# Patient Record
Sex: Female | Born: 1975
Health system: Southern US, Community
[De-identification: ages and names within clinical notes are randomized; demographics above are authoritative.]

## PROBLEM LIST (undated history)

## (undated) DIAGNOSIS — Z973 Presence of spectacles and contact lenses: Secondary | ICD-10-CM

## (undated) DIAGNOSIS — J189 Pneumonia, unspecified organism: Secondary | ICD-10-CM

## (undated) DIAGNOSIS — IMO0002 Reserved for concepts with insufficient information to code with codable children: Secondary | ICD-10-CM

## (undated) DIAGNOSIS — G43909 Migraine, unspecified, not intractable, without status migrainosus: Secondary | ICD-10-CM

## (undated) DIAGNOSIS — M069 Rheumatoid arthritis, unspecified: Secondary | ICD-10-CM

## (undated) DIAGNOSIS — R51 Headache: Secondary | ICD-10-CM

## (undated) DIAGNOSIS — R87619 Unspecified abnormal cytological findings in specimens from cervix uteri: Secondary | ICD-10-CM

## (undated) DIAGNOSIS — Z8759 Personal history of other complications of pregnancy, childbirth and the puerperium: Secondary | ICD-10-CM

## (undated) DIAGNOSIS — M1990 Unspecified osteoarthritis, unspecified site: Secondary | ICD-10-CM

## (undated) DIAGNOSIS — O139 Gestational [pregnancy-induced] hypertension without significant proteinuria, unspecified trimester: Secondary | ICD-10-CM

## (undated) DIAGNOSIS — F419 Anxiety disorder, unspecified: Secondary | ICD-10-CM

## (undated) DIAGNOSIS — K219 Gastro-esophageal reflux disease without esophagitis: Secondary | ICD-10-CM

## (undated) HISTORY — DX: Rheumatoid arthritis, unspecified: M06.9

## (undated) HISTORY — DX: Unspecified abnormal cytological findings in specimens from cervix uteri: R87.619

## (undated) HISTORY — PX: WISDOM TOOTH EXTRACTION: SHX21

## (undated) HISTORY — PX: GASTRIC BYPASS: SHX52

## (undated) HISTORY — DX: Headache: R51

## (undated) HISTORY — DX: Pneumonia, unspecified organism: J18.9

## (undated) HISTORY — DX: Gastro-esophageal reflux disease without esophagitis: K21.9

## (undated) HISTORY — DX: Migraine, unspecified, not intractable, without status migrainosus: G43.909

## (undated) HISTORY — DX: Reserved for concepts with insufficient information to code with codable children: IMO0002

---

## 2000-12-22 ENCOUNTER — Other Ambulatory Visit: Admission: RE | Admit: 2000-12-22 | Discharge: 2000-12-22 | Payer: Self-pay | Admitting: Obstetrics and Gynecology

## 2001-02-10 ENCOUNTER — Encounter: Payer: Self-pay | Admitting: Obstetrics and Gynecology

## 2001-02-10 ENCOUNTER — Ambulatory Visit (HOSPITAL_COMMUNITY): Admission: RE | Admit: 2001-02-10 | Discharge: 2001-02-10 | Payer: Self-pay | Admitting: Obstetrics and Gynecology

## 2001-05-25 ENCOUNTER — Ambulatory Visit (HOSPITAL_COMMUNITY): Admission: RE | Admit: 2001-05-25 | Discharge: 2001-05-25 | Payer: Self-pay | Admitting: Obstetrics and Gynecology

## 2001-05-25 ENCOUNTER — Encounter: Payer: Self-pay | Admitting: Obstetrics and Gynecology

## 2001-06-23 ENCOUNTER — Inpatient Hospital Stay (HOSPITAL_COMMUNITY): Admission: AD | Admit: 2001-06-23 | Discharge: 2001-06-23 | Payer: Self-pay | Admitting: Obstetrics and Gynecology

## 2001-07-07 ENCOUNTER — Inpatient Hospital Stay (HOSPITAL_COMMUNITY): Admission: AD | Admit: 2001-07-07 | Discharge: 2001-07-09 | Payer: Self-pay | Admitting: Obstetrics and Gynecology

## 2001-07-07 ENCOUNTER — Inpatient Hospital Stay (HOSPITAL_COMMUNITY): Admission: AD | Admit: 2001-07-07 | Discharge: 2001-07-07 | Payer: Self-pay | Admitting: Obstetrics and Gynecology

## 2002-07-26 ENCOUNTER — Ambulatory Visit (HOSPITAL_COMMUNITY): Admission: RE | Admit: 2002-07-26 | Discharge: 2002-07-26 | Payer: Self-pay | Admitting: Obstetrics and Gynecology

## 2002-07-26 ENCOUNTER — Encounter: Payer: Self-pay | Admitting: Obstetrics and Gynecology

## 2002-11-13 ENCOUNTER — Inpatient Hospital Stay (HOSPITAL_COMMUNITY): Admission: AD | Admit: 2002-11-13 | Discharge: 2002-11-13 | Payer: Self-pay | Admitting: Obstetrics and Gynecology

## 2002-12-05 ENCOUNTER — Other Ambulatory Visit: Admission: RE | Admit: 2002-12-05 | Discharge: 2002-12-05 | Payer: Self-pay | Admitting: Obstetrics and Gynecology

## 2002-12-16 ENCOUNTER — Inpatient Hospital Stay (HOSPITAL_COMMUNITY): Admission: AD | Admit: 2002-12-16 | Discharge: 2002-12-16 | Payer: Self-pay | Admitting: Obstetrics and Gynecology

## 2002-12-22 ENCOUNTER — Inpatient Hospital Stay (HOSPITAL_COMMUNITY): Admission: AD | Admit: 2002-12-22 | Discharge: 2002-12-24 | Payer: Self-pay | Admitting: Obstetrics and Gynecology

## 2003-06-16 ENCOUNTER — Emergency Department (HOSPITAL_COMMUNITY): Admission: EM | Admit: 2003-06-16 | Discharge: 2003-06-16 | Payer: Self-pay | Admitting: Emergency Medicine

## 2004-01-03 ENCOUNTER — Other Ambulatory Visit: Admission: RE | Admit: 2004-01-03 | Discharge: 2004-01-03 | Payer: Self-pay | Admitting: Obstetrics and Gynecology

## 2005-12-16 ENCOUNTER — Other Ambulatory Visit: Admission: RE | Admit: 2005-12-16 | Discharge: 2005-12-16 | Payer: Self-pay | Admitting: Obstetrics and Gynecology

## 2011-06-18 ENCOUNTER — Ambulatory Visit: Payer: BC Managed Care – PPO | Admitting: Obstetrics and Gynecology

## 2011-07-07 ENCOUNTER — Ambulatory Visit (INDEPENDENT_AMBULATORY_CARE_PROVIDER_SITE_OTHER): Payer: BC Managed Care – PPO | Admitting: Obstetrics and Gynecology

## 2011-07-07 DIAGNOSIS — N898 Other specified noninflammatory disorders of vagina: Secondary | ICD-10-CM

## 2011-07-07 DIAGNOSIS — Z01419 Encounter for gynecological examination (general) (routine) without abnormal findings: Secondary | ICD-10-CM

## 2011-08-16 ENCOUNTER — Telehealth: Payer: Self-pay | Admitting: Obstetrics and Gynecology

## 2011-08-16 NOTE — Telephone Encounter (Signed)
Routed to triage/pt appt was canc. For today/no resched. yet

## 2011-08-17 ENCOUNTER — Encounter: Payer: BC Managed Care – PPO | Admitting: Obstetrics and Gynecology

## 2011-08-18 ENCOUNTER — Telehealth: Payer: Self-pay

## 2011-08-18 NOTE — Telephone Encounter (Signed)
Spoke with pt rgd msg to r/s colpo pt has appt 09/09/11 at 3:15 with ND pt voice understanding

## 2011-08-18 NOTE — Telephone Encounter (Signed)
Message copied by Rolla Plate on Wed Aug 18, 2011 11:11 AM ------      Message from: Nicanor Bake      Created: Wed Aug 18, 2011  8:55 AM      Regarding: no show       Patient was scheduled for colpo and did not show up for appt.  Please r/s.  Thanks

## 2011-09-08 ENCOUNTER — Encounter: Payer: Self-pay | Admitting: Obstetrics and Gynecology

## 2011-09-08 DIAGNOSIS — K219 Gastro-esophageal reflux disease without esophagitis: Secondary | ICD-10-CM | POA: Insufficient documentation

## 2011-09-08 DIAGNOSIS — Z8679 Personal history of other diseases of the circulatory system: Secondary | ICD-10-CM | POA: Insufficient documentation

## 2011-09-08 DIAGNOSIS — R4689 Other symptoms and signs involving appearance and behavior: Secondary | ICD-10-CM | POA: Insufficient documentation

## 2011-09-08 DIAGNOSIS — O98919 Unspecified maternal infectious and parasitic disease complicating pregnancy, unspecified trimester: Secondary | ICD-10-CM | POA: Insufficient documentation

## 2011-09-09 ENCOUNTER — Encounter (INDEPENDENT_AMBULATORY_CARE_PROVIDER_SITE_OTHER): Payer: BC Managed Care – PPO

## 2011-09-09 ENCOUNTER — Ambulatory Visit (INDEPENDENT_AMBULATORY_CARE_PROVIDER_SITE_OTHER): Payer: BC Managed Care – PPO | Admitting: Obstetrics and Gynecology

## 2011-09-09 ENCOUNTER — Encounter: Payer: Self-pay | Admitting: Obstetrics and Gynecology

## 2011-09-09 VITALS — BP 138/80 | Ht 67.0 in | Wt 292.0 lb

## 2011-09-09 DIAGNOSIS — R87610 Atypical squamous cells of undetermined significance on cytologic smear of cervix (ASC-US): Secondary | ICD-10-CM

## 2011-09-09 DIAGNOSIS — IMO0001 Reserved for inherently not codable concepts without codable children: Secondary | ICD-10-CM

## 2011-09-09 LAB — POCT URINE PREGNANCY: Preg Test, Ur: NEGATIVE

## 2011-09-09 NOTE — Progress Notes (Signed)
Previous Pap Smear: 07/07/11 HPV detected, ASCUS Previous Colposcopy: 07/29/10 biopsy at 6 o'clock, no atypia identified  Referred From: n/a LMP: 08/02/11 Contraception: Lo/ovral  G,P: 2, 2 UPT negative  Gave pt Ibuprofen 600 mg Parker, Charetha Aw changes and mosacisim seen at 11oclock.  Biopsy taken from 11 o clock with ECC.  monsels applied and hemostasis noted Will call pt with results.

## 2011-09-15 LAB — PATHOLOGY

## 2011-09-28 ENCOUNTER — Telehealth: Payer: Self-pay

## 2011-09-28 NOTE — Telephone Encounter (Signed)
Message copied by Rolla Plate on Tue Sep 28, 2011  9:17 AM ------      Message from: Jaymes Graff      Created: Mon Sep 27, 2011  8:12 PM       Please review colpo results with patient and tell her I recommend a pap every six months for the next year.

## 2011-09-28 NOTE — Telephone Encounter (Signed)
Spoke with pt rgd labs informed colpo consistent with pap repeat pap every 6 months pt voice understanding 

## 2011-11-08 ENCOUNTER — Ambulatory Visit (INDEPENDENT_AMBULATORY_CARE_PROVIDER_SITE_OTHER): Payer: BC Managed Care – PPO | Admitting: Family Medicine

## 2011-11-08 ENCOUNTER — Telehealth: Payer: Self-pay | Admitting: Obstetrics and Gynecology

## 2011-11-08 VITALS — BP 130/88 | HR 73 | Temp 98.0°F | Resp 16 | Ht 66.5 in | Wt 293.0 lb

## 2011-11-08 DIAGNOSIS — R062 Wheezing: Secondary | ICD-10-CM

## 2011-11-08 DIAGNOSIS — J45909 Unspecified asthma, uncomplicated: Secondary | ICD-10-CM

## 2011-11-08 MED ORDER — ALBUTEROL SULFATE HFA 108 (90 BASE) MCG/ACT IN AERS: 2.0000 | INHALATION_SPRAY | Freq: Four times a day (QID) | RESPIRATORY_TRACT | Status: DC | PRN

## 2011-11-08 MED ORDER — ALBUTEROL SULFATE (2.5 MG/3ML) 0.083% IN NEBU
2.5000 mg | INHALATION_SOLUTION | Freq: Once | RESPIRATORY_TRACT | Status: AC
  Administered 2011-11-08: 2.5 mg via RESPIRATORY_TRACT

## 2011-11-08 NOTE — Progress Notes (Signed)
Date:  11/08/2011   Name:  Pamela Hunt   DOB:  [birth year]   MRN:  [id number]  PCP:  Michael Litter, MD    Chief Complaint: Asthma   History of Present Illness:  Pamela Hunt is a 36 y.o. very pleasant female patient who presents with the following:  She was exposed to dust yesterday while cleaning- this morning she noted SOB and wheezing.  She did not have an inhaler available at home. She does tend to have wheezing at times, especially when she is ill or exposed to an allergen.  Otherwise her asthma symptoms are minimal.  She has not noted any other symptoms of illness such as cough, fever, or URI symptoms.   LMP was last month- she takes 90 day OCP cycle.  States there is no risk of pregnancy.   Patient Active Problem List  Diagnosis  . Hx of acid reflux disease  . Hx of closely spaced pregnancy  . Hx of asthma  . Hx of increased body habitus    Past Medical History  Diagnosis Date  . Headache     migraines  . Asthma   . Abnormal Pap smear   . Pneumonia     Had as a child    No past surgical history on file.  History  Substance Use Topics  . Smoking status: Never Smoker   . Smokeless tobacco: Not on file  . Alcohol Use: Yes     socially     Family History  Problem Relation Age of Onset  . Hypertension Mother   . Thyroid nodules Mother     Allergies  Allergen Reactions  . Tetracyclines & Related   . Tomato     Medication list has been reviewed and updated.  Current Outpatient Prescriptions on File Prior to Visit  Medication Sig Dispense Refill  . norgestrel-ethinyl estradiol (LO/OVRAL,CRYSELLE) 0.3-30 MG-MCG tablet Take 1 tablet by mouth daily.      Marland Kitchen topiramate (TOPAMAX) 100 MG tablet Take 100 mg by mouth 2 (two) times daily.        Review of Systems:  As per HPI- otherwise negative.   Physical Examination: Filed Vitals:   11/08/11 1316  BP: 130/88  Pulse: 73  Temp: 98 F (36.7 C)  Resp: 16   Filed Vitals:   11/08/11 1316    Height: 5' 6.5" (1.689 m)  Weight: 293 lb (132.904 kg)   Body mass index is 46.58 kg/(m^2). Ideal Body Weight: Weight in (lb) to have BMI = 25: 156.9   GEN: WDWN, NAD, Non-toxic, A & O x 3, obese HEENT: Atraumatic, Normocephalic. Neck supple. No masses, No LAD.  TM and oropharynx wnl, PEERL, EOMI.   Ears and Nose: No external deformity. CV: RRR, No M/G/R. No JVD. No thrill. No extra heart sounds. PULM: CTA B, no wheezes, crackles, rhonchi. No retractions. No resp. distress. No accessory muscle use.  Some decreased air movement.  Gave albuterol neb- she felt a lot better and air movement improved.   EXTR: No c/c/e NEURO Normal gait.  PSYCH: Normally interactive. Conversant. Not depressed or anxious appearing.  Calm demeanor.    Assessment and Plan: 1. Asthma  albuterol (PROVENTIL HFA;VENTOLIN HFA) 108 (90 BASE) MCG/ACT inhaler  2. Wheezing  albuterol (PROVENTIL) (2.5 MG/3ML) 0.083% nebulizer solution 2.5 mg   Periodic RAD/ asthma type symptoms.  Gave albuterol neb- she felt a lot better.  Gave inhaler to use at home as needed. Let me know if  symptoms continue or change.    Abbe Amsterdam, MD

## 2011-11-08 NOTE — Telephone Encounter (Signed)
Spoke with pt rgd msg pt wants refill on albuterol inhaler advised pt rx written in 2004 need ov need to f/u with pcp for rx for inhaler pt voice understanding

## 2011-11-18 ENCOUNTER — Telehealth: Payer: Self-pay

## 2011-11-18 ENCOUNTER — Other Ambulatory Visit: Payer: Self-pay

## 2011-11-18 MED ORDER — NORGESTREL-ETHINYL ESTRADIOL 0.3-30 MG-MCG PO TABS: 1.0000 | ORAL_TABLET | Freq: Every day | ORAL | Status: DC

## 2011-11-18 NOTE — Telephone Encounter (Signed)
Spoke with pt rgd msg informed rx sent to pharm pt voice understanding 

## 2011-11-24 ENCOUNTER — Telehealth: Payer: Self-pay | Admitting: Obstetrics and Gynecology

## 2011-11-24 MED ORDER — NORETHINDRONE ACET-ETHINYL EST 1.5-30 MG-MCG PO TABS: 1.0000 | ORAL_TABLET | Freq: Every day | ORAL | Status: DC

## 2011-11-24 NOTE — Telephone Encounter (Signed)
NICCOLE/ND PT °

## 2011-11-24 NOTE — Telephone Encounter (Signed)
Spoke with pt states rx should be for 3 month supply advised pt rx sent to pharm pt voice understadning

## 2012-04-19 ENCOUNTER — Other Ambulatory Visit: Payer: Self-pay | Admitting: Obstetrics and Gynecology

## 2012-04-27 ENCOUNTER — Other Ambulatory Visit: Payer: Self-pay | Admitting: Obstetrics and Gynecology

## 2012-05-25 ENCOUNTER — Ambulatory Visit (INDEPENDENT_AMBULATORY_CARE_PROVIDER_SITE_OTHER): Payer: BC Managed Care – PPO | Admitting: Physician Assistant

## 2012-05-25 VITALS — BP 140/90 | HR 98 | Temp 98.8°F | Resp 16 | Ht 66.5 in | Wt 302.4 lb

## 2012-05-25 DIAGNOSIS — R059 Cough, unspecified: Secondary | ICD-10-CM

## 2012-05-25 DIAGNOSIS — J45909 Unspecified asthma, uncomplicated: Secondary | ICD-10-CM

## 2012-05-25 DIAGNOSIS — R05 Cough: Secondary | ICD-10-CM

## 2012-05-25 DIAGNOSIS — J069 Acute upper respiratory infection, unspecified: Secondary | ICD-10-CM

## 2012-05-25 MED ORDER — HYDROCODONE-HOMATROPINE 5-1.5 MG/5ML PO SYRP: 5.0000 mL | ORAL_SOLUTION | Freq: Three times a day (TID) | ORAL | Status: DC | PRN

## 2012-05-25 MED ORDER — IPRATROPIUM BROMIDE 0.03 % NA SOLN: 2.0000 | Freq: Two times a day (BID) | NASAL | Status: DC

## 2012-05-25 MED ORDER — BENZONATATE 100 MG PO CAPS: 100.0000 mg | ORAL_CAPSULE | Freq: Three times a day (TID) | ORAL | Status: DC | PRN

## 2012-05-25 MED ORDER — ALBUTEROL SULFATE HFA 108 (90 BASE) MCG/ACT IN AERS: 2.0000 | INHALATION_SPRAY | RESPIRATORY_TRACT | Status: DC | PRN

## 2012-05-25 NOTE — Progress Notes (Signed)
Subjective:    Patient ID: Pamela Hunt, female    DOB: 08-31-1975, 37 y.o.   MRN: [id number]  HPI   Ms. Pamela Hunt is a 37 yr old female here with concern for illness.  States she has been sick for the last 5 days.  Most bothersome symptom is non-productive cough.  States she is really tired.  Endorses runny nose, sore throat, post-nasal drainage, and chills.  No fever.  Some headache and body aches as well.  No flu shot this season.  Denies sinus pressure.  Experiences both SOB and wheezing after she coughs a lot.  Currently has no chest tightness, wheezing, or SOB.    Hx asthma treated with albuterol only.  States she uses albuterol once per month, if that.  She has not used albuterol since she's been sick.    No GI or urinary symptoms.  Son had a sore throat last weekend, otherwise no other sick contacts.  Tylenol cold for symptom relief.  Feeling somewhat better but chest feels sore from coughing so much.  Cough is especially bothersome at night.     Review of Systems  Constitutional: Positive for chills, appetite change (decreased) and fatigue. Negative for fever.  HENT: Positive for congestion, sore throat, rhinorrhea and postnasal drip. Negative for ear pain and sinus pressure.   Respiratory: Positive for cough, shortness of breath and wheezing.   Cardiovascular: Positive for chest pain (with cough).  Gastrointestinal: Negative.   Musculoskeletal: Positive for myalgias and arthralgias.  Skin: Negative.   Neurological: Negative.        Objective:   Physical Exam  Vitals reviewed. Constitutional: She is oriented to person, place, and time. She appears well-developed and well-nourished. No distress.  HENT:  Head: Normocephalic and atraumatic.  Right Ear: Tympanic membrane and ear canal normal.  Left Ear: Tympanic membrane and ear canal normal.  Nose: Rhinorrhea present. Right sinus exhibits no maxillary sinus tenderness and no frontal sinus tenderness. Left sinus exhibits no  maxillary sinus tenderness and no frontal sinus tenderness.  Mouth/Throat: Uvula is midline, oropharynx is clear and moist and mucous membranes are normal.  Eyes: Conjunctivae normal are normal. No scleral icterus.  Neck: Neck supple.  Cardiovascular: Normal rate, regular rhythm, normal heart sounds and intact distal pulses.  Exam reveals no gallop and no friction rub.   No murmur heard. Pulmonary/Chest: Effort normal and breath sounds normal. She has no decreased breath sounds. She has no wheezes. She has no rhonchi. She has no rales.  Lymphadenopathy:    She has no cervical adenopathy.  Neurological: She is alert and oriented to person, place, and time.  Skin: Skin is warm.  Psychiatric: She has a normal mood and affect. Her behavior is normal.     Filed Vitals:   05/25/12 1059  BP: 140/90  Pulse: 98  Temp: 98.8 F (37.1 C)  Resp: 16  SpO2 98% on RA      Assessment & Plan:   1. Cough  benzonatate (TESSALON) 100 MG capsule, HYDROcodone-homatropine (HYCODAN) 5-1.5 MG/5ML syrup  2. URI (upper respiratory infection)  ipratropium (ATROVENT) 0.03 % nasal spray  3. Asthma  albuterol (PROVENTIL HFA;VENTOLIN HFA) 108 (90 BASE) MCG/ACT inhaler    Ms. Pamela Hunt is a pleasant 37 yr old female here with URI symptoms and underlying asthma.  She is afebrile, lungs are CTA, and cough is nonproductive.  Symptoms sounds somewhat flu-like, but given that she is 5 days out from symptom onset, testing would not change management.  Will treat cough with scheduled albuterol q6h for then next 1-2 days, then prn.  Tessalon and Hycodan for cough suppression.  Atrovent nasal and otc antihistamine for nasal symptoms and post-nasal drainage.  Push fluids.  Discussed RTC precautions.  Pt understands and is in agreement with this plan.

## 2012-05-25 NOTE — Patient Instructions (Addendum)
Begin using the albuterol every 6 hours for the next 1-2 days, then as needed after that.  Tessalon Perles for cough during the day, and Hycodan syrup for cough at night - this will make you sleepy.  Atrovent nasal spray twice a day to help with runny nose and post-nasal drainage.  An allergy medication will also be beneficial - you can get this generic over the counter (Claritin, Zyrtec, or Allegra).  Plenty of fluids (water is best!) and rest.  Please let us know if you feel like you're worsening or not improving, specifically if you develop fever >101.24F, worsening cough, worsening shortness of breath, or if new symptoms develop,   Upper Respiratory Infection, Adult An upper respiratory infection (URI) is also sometimes known as the common cold. The upper respiratory tract includes the nose, sinuses, throat, trachea, and bronchi. Bronchi are the airways leading to the lungs. Most people improve within 1 week, but symptoms can last up to 2 weeks. A residual cough may last even longer.  CAUSES Many different viruses can infect the tissues lining the upper respiratory tract. The tissues become irritated and inflamed and often become very moist. Mucus production is also common. A cold is contagious. You can easily spread the virus to others by oral contact. This includes kissing, sharing a glass, coughing, or sneezing. Touching your mouth or nose and then touching a surface, which is then touched by another person, can also spread the virus. SYMPTOMS  Symptoms typically develop 1 to 3 days after you come in contact with a cold virus. Symptoms vary from person to person. They may include:  Runny nose.  Sneezing.  Nasal congestion.  Sinus irritation.  Sore throat.  Loss of voice (laryngitis).  Cough.  Fatigue.  Muscle aches.  Loss of appetite.  Headache.  Low-grade fever. DIAGNOSIS  You might diagnose your own cold based on familiar symptoms, since most people get a cold 2 to 3 times a  year. Your caregiver can confirm this based on your exam. Most importantly, your caregiver can check that your symptoms are not due to another disease such as strep throat, sinusitis, pneumonia, asthma, or epiglottitis. Blood tests, throat tests, and X-rays are not necessary to diagnose a common cold, but they may sometimes be helpful in excluding other more serious diseases. Your caregiver will decide if any further tests are required. RISKS AND COMPLICATIONS  You may be at risk for a more severe case of the common cold if you smoke cigarettes, have chronic heart disease (such as heart failure) or lung disease (such as asthma), or if you have a weakened immune system. The very young and very old are also at risk for more serious infections. Bacterial sinusitis, middle ear infections, and bacterial pneumonia can complicate the common cold. The common cold can worsen asthma and chronic obstructive pulmonary disease (COPD). Sometimes, these complications can require emergency medical care and may be life-threatening. PREVENTION  The best way to protect against getting a cold is to practice good hygiene. Avoid oral or hand contact with people with cold symptoms. Wash your hands often if contact occurs. There is no clear evidence that vitamin C, vitamin E, echinacea, or exercise reduces the chance of developing a cold. However, it is always recommended to get plenty of rest and practice good nutrition. TREATMENT  Treatment is directed at relieving symptoms. There is no cure. Antibiotics are not effective, because the infection is caused by a virus, not by bacteria. Treatment may include:  Increased  fluid intake. Sports drinks offer valuable electrolytes, sugars, and fluids.  Breathing heated mist or steam (vaporizer or shower).  Eating chicken soup or other clear broths, and maintaining good nutrition.  Getting plenty of rest.  Using gargles or lozenges for comfort.  Controlling fevers with ibuprofen  or acetaminophen as directed by your caregiver.  Increasing usage of your inhaler if you have asthma. Zinc gel and zinc lozenges, taken in the first 24 hours of the common cold, can shorten the duration and lessen the severity of symptoms. Pain medicines may help with fever, muscle aches, and throat pain. A variety of non-prescription medicines are available to treat congestion and runny nose. Your caregiver can make recommendations and may suggest nasal or lung inhalers for other symptoms.  HOME CARE INSTRUCTIONS   Only take over-the-counter or prescription medicines for pain, discomfort, or fever as directed by your caregiver.  Use a warm mist humidifier or inhale steam from a shower to increase air moisture. This may keep secretions moist and make it easier to breathe.  Drink enough water and fluids to keep your urine clear or pale yellow.  Rest as needed.  Return to work when your temperature has returned to normal or as your caregiver advises. You may need to stay home longer to avoid infecting others. You can also use a face mask and careful hand washing to prevent spread of the virus. SEEK MEDICAL CARE IF:   After the first few days, you feel you are getting worse rather than better.  You need your caregiver's advice about medicines to control symptoms.  You develop chills, worsening shortness of breath, or brown or red sputum. These may be signs of pneumonia.  You develop yellow or brown nasal discharge or pain in the face, especially when you bend forward. These may be signs of sinusitis.  You develop a fever, swollen neck glands, pain with swallowing, or white areas in the back of your throat. These may be signs of strep throat. SEEK IMMEDIATE MEDICAL CARE IF:   You have a fever.  You develop severe or persistent headache, ear pain, sinus pain, or chest pain.  You develop wheezing, a prolonged cough, cough up blood, or have a change in your usual mucus (if you have chronic  lung disease).  You develop sore muscles or a stiff neck. Document Released: 09/29/2000 Document Revised: 06/28/2011 Document Reviewed: 08/07/2010 [facility] Patient Information 2013 [place], [place].

## 2013-02-08 ENCOUNTER — Ambulatory Visit (INDEPENDENT_AMBULATORY_CARE_PROVIDER_SITE_OTHER): Payer: BC Managed Care – PPO | Admitting: General Surgery

## 2013-02-08 ENCOUNTER — Encounter (INDEPENDENT_AMBULATORY_CARE_PROVIDER_SITE_OTHER): Payer: Self-pay | Admitting: General Surgery

## 2013-02-08 VITALS — BP 130/77 | HR 76 | Temp 98.1°F | Resp 14 | Ht 67.0 in | Wt 326.2 lb

## 2013-02-08 DIAGNOSIS — Z6841 Body Mass Index (BMI) 40.0 and over, adult: Secondary | ICD-10-CM

## 2013-02-08 NOTE — Progress Notes (Signed)
Patient ID: Pamela Hunt Hunt, female   DOB: 07/09/1975, 37 y.o.   MRN: [id number]  Chief Complaint  Patient presents with  . Bariatric Pre-op  . Weight Loss Surgery    HPI Pamela Hunt Hunt is a 37 y.o. female.   HPI 37 year old [place] American female referred by Dr. Shirlean Mylar for evaluation of weight loss surgery. The patient is specifically interested in laparoscopic Roux-en-Y gastric bypass. She states that she has done a lot of research and she feels this is the best procedure for her based on its long-term data. The patient has struggled her with her weight most of her adult life. Despite numerous attempts for sustained weight loss the patient has been unsuccessful. Over the years the patient has tried Vickey Sages, [employer], Doylene Bode, Slim fast, herbal life, Nutrisystem, but have a life as well as hCG without any long-term success.  Past Medical History  Diagnosis Date  . Headache(784.0)     migraines  . Asthma   . Abnormal Pap smear   . Pneumonia     Had as a child    History reviewed. No pertinent past surgical history.  Family History  Problem Relation Age of Onset  . Thyroid nodules Mother   . Thyroid nodules Father   . Arthritis Brother   . Thyroid nodules Maternal Grandmother   . Cancer Paternal Grandmother   . Heart disease Paternal Grandfather     Social History History  Substance Use Topics  . Smoking status: Never Smoker   . Smokeless tobacco: Not on file  . Alcohol Use: Yes     Comment: socially     Allergies  Allergen Reactions  . Tetracyclines & Related   . Tomato     Current Outpatient Prescriptions  Medication Sig Dispense Refill  . albuterol (PROVENTIL HFA;VENTOLIN HFA) 108 (90 BASE) MCG/ACT inhaler Inhale 2 puffs into the lungs every 4 (four) hours as needed for wheezing (cough, shortness of breath or wheezing.).  1 Inhaler  3  . benzonatate (TESSALON) 100 MG capsule Take 1-2 capsules (100-200 mg total) by mouth 3 (three) times daily as  needed for cough.  40 capsule  0  . HYDROcodone-homatropine (HYCODAN) 5-1.5 MG/5ML syrup Take 5 mLs by mouth every 8 (eight) hours as needed for cough.  120 mL  0  . ipratropium (ATROVENT) 0.03 % nasal spray Place 2 sprays into the nose 2 (two) times daily.  30 mL  1  . JUNEL 1.5/30 1.5-30 MG-MCG tablet TAKE 1 TABLET BY MOUTH DAILY.  63 tablet  1  . topiramate (TOPAMAX) 100 MG tablet Take 100 mg by mouth 2 (two) times daily.      Marland Kitchen albuterol (PROVENTIL HFA;VENTOLIN HFA) 108 (90 BASE) MCG/ACT inhaler Inhale 2 puffs into the lungs every 6 (six) hours as needed for wheezing.  1 Inhaler  4   No current facility-administered medications for this visit.    Review of Systems Review of Systems  Constitutional: Negative for fever, activity change, appetite change and unexpected weight change.  HENT: Negative for nosebleeds and trouble swallowing.   Eyes: Negative for photophobia, discharge and visual disturbance.  Respiratory: Positive for wheezing. Negative for chest tightness and shortness of breath.        Uses inhalers prn; no hospitalizations for asthma; reports she had a negative sleep study 3 yrs ago at the sleep center   Cardiovascular: Negative for chest pain and leg swelling.       Denies CP, SOB, orthopnea, PND, DOE  Gastrointestinal: Negative for nausea, vomiting, abdominal pain, diarrhea, constipation and abdominal distention.       Takes ompeprazole daily for reflux  Endocrine: Negative for cold intolerance, heat intolerance, polydipsia and polyuria.  Genitourinary: Negative for dysuria, urgency, hematuria, difficulty urinating and menstrual problem.       G2P2  Musculoskeletal: Negative for arthralgias.       Reports low back and rt knee pain  Skin: Negative for pallor and rash.  Neurological: Positive for headaches (with menstrual periods). Negative for dizziness, seizures, facial asymmetry and numbness.       Denies TIA and amaurosis fugax   Hematological: Negative for  adenopathy. Does not bruise/bleed easily.  Psychiatric/Behavioral: Negative for behavioral problems and agitation.    Blood pressure 130/77, pulse 76, temperature 98.1 F (36.7 C), temperature source Temporal, resp. rate 14, height 5\' 7"  (1.702 m), weight 326 lb 3.2 oz (147.963 kg).  Physical Exam Physical Exam  Data Reviewed Self diet history Epworth Sleepiness Scale score 2  Assessment    Morbid obesity BMI 51.17 Elevated Blood pressure Right knee pain Low back pain Asthma     Plan    The patient meets weight loss surgery criteria. I think the patient would be an acceptable candidate for Laparoscopic Roux-en-Y Gastric bypass.   We discussed laparoscopic Roux-en-Y gastric bypass. We discussed the preoperative, operative and postoperative process. Using diagrams, I explained the surgery in detail including the performance of an EGD near the end of the surgery and an Upper GI swallow study on POD 1. We discussed the typical hospital course including a 2-3 day stay baring any complications.   The patient was given educational material. I quoted the patient that they can expect to lose 50-70% of their excess weight with the gastric bypass. We did discuss the possibility of weight regain several years after the procedure.  We discussed the risk and benefits of surgery including but not limited to anesthesia risk, bleeding, infection, anastomotic edema requiring a few additional days in the hospital, postop nausea, possible conversion to open procedure, blood clot formation, anastomotic leak, anastomotic stricture, ulcer formation, death, respiratory complications, intestinal blockage, internal hernia, gallstone formation, vitamin and nutritional deficiencies, hair loss, weight regain injury to surrounding structures, failure to lose weight and mood changes.  We discussed that before and after surgery that there would be an alteration in their diet. I explained that we have put them on a  diet 2 weeks before surgery. I also explained that they would be on a liquid diet for 2 weeks after surgery. We discussed that they would have to avoid certain foods such as sugar after surgery. We discussed the importance of physical activity as well as compliance with our dietary and supplement recommendations and routine follow-up.  I explained to the patient that we will start our evaluation process which includes labs, Upper GI to evaluate stomach and swallowing anatomy, nutritionist consultation, psychiatrist consultation, EKG, CXR, abdominal ultrasound.  Mary Sella. Andrey Campanile, MD, FACS General, Bariatric, & Minimally Invasive Surgery [facility], [place]             [facility] M 02/08/2013, 12:35 PM

## 2013-02-08 NOTE — Patient Instructions (Signed)
We will start the process Please call me with any questions  

## 2013-02-26 ENCOUNTER — Ambulatory Visit (HOSPITAL_COMMUNITY): Payer: BC Managed Care – PPO

## 2013-02-26 ENCOUNTER — Other Ambulatory Visit (HOSPITAL_COMMUNITY): Payer: Self-pay

## 2013-02-28 ENCOUNTER — Encounter: Payer: Self-pay | Admitting: Dietician

## 2013-02-28 ENCOUNTER — Encounter: Payer: BC Managed Care – PPO | Attending: General Surgery | Admitting: Dietician

## 2013-02-28 VITALS — Ht 67.0 in | Wt 328.1 lb

## 2013-02-28 DIAGNOSIS — Z713 Dietary counseling and surveillance: Secondary | ICD-10-CM | POA: Insufficient documentation

## 2013-02-28 NOTE — Patient Instructions (Signed)
Patient to call the [facility] to enroll in Pre-Op and Post-Op Nutrition Education when surgery date is scheduled. 

## 2013-02-28 NOTE — Progress Notes (Signed)
  Pre-Op Assessment Visit:  Pre-Operative RYGB Surgery  Medical Nutrition Therapy:  Appt start time: 1500   End time:  1600.  Patient was seen on 02/28/13 for Pre-Operative RYGB Nutrition Assessment. Assessment and letter of approval faxed to [facility] B[facility]ric [facility] Program coordinator on 02/28/13.   Handouts given during visit include:  Pre-Op Goals Bariatric Surgery Protein Shakes  Supplements given during visit include:  2 Chocolate Unjury, lot #: [id number], exp: 1/16 2 Chocolate Premier, 2, lot #: [id number], exp: 1/16  Patient to call the [facility] to enroll in Pre-Op and Post-Op Nutrition Education when surgery date is scheduled.

## 2013-05-04 ENCOUNTER — Other Ambulatory Visit: Payer: Self-pay

## 2013-05-04 ENCOUNTER — Ambulatory Visit (HOSPITAL_COMMUNITY)
Admission: RE | Admit: 2013-05-04 | Discharge: 2013-05-04 | Disposition: A | Discharge status: Home / Self Care | Payer: BC Managed Care – PPO | Source: Ambulatory Visit | Attending: General Surgery | Admitting: General Surgery

## 2013-05-04 DIAGNOSIS — M25569 Pain in unspecified knee: Secondary | ICD-10-CM | POA: Insufficient documentation

## 2013-05-04 DIAGNOSIS — Z6841 Body Mass Index (BMI) 40.0 and over, adult: Secondary | ICD-10-CM | POA: Insufficient documentation

## 2013-05-04 DIAGNOSIS — M545 Low back pain, unspecified: Secondary | ICD-10-CM | POA: Insufficient documentation

## 2013-05-04 DIAGNOSIS — J45909 Unspecified asthma, uncomplicated: Secondary | ICD-10-CM | POA: Insufficient documentation

## 2013-05-04 DIAGNOSIS — R030 Elevated blood-pressure reading, without diagnosis of hypertension: Secondary | ICD-10-CM | POA: Insufficient documentation

## 2013-05-04 LAB — COMPREHENSIVE METABOLIC PANEL
ALT: 11 U/L (ref 0–35)
AST: 13 U/L (ref 0–37)
Albumin: 3.4 g/dL — ABNORMAL LOW (ref 3.5–5.2)
Alkaline Phosphatase: 76 U/L (ref 39–117)
BUN: 8 mg/dL (ref 6–23)
CO2: 25 mEq/L (ref 19–32)
Calcium: 8.7 mg/dL (ref 8.4–10.5)
Chloride: 107 mEq/L (ref 96–112)
Creat: 0.97 mg/dL (ref 0.50–1.10)
Glucose, Bld: 80 mg/dL (ref 70–99)
Potassium: 4.4 mEq/L (ref 3.5–5.3)
Sodium: 139 mEq/L (ref 135–145)
Total Bilirubin: 0.4 mg/dL (ref 0.3–1.2)
Total Protein: 6.4 g/dL (ref 6.0–8.3)

## 2013-05-04 LAB — CBC WITH DIFFERENTIAL/PLATELET
Basophils Absolute: 0 10*3/uL (ref 0.0–0.1)
Basophils Relative: 0 % (ref 0–1)
Eosinophils Absolute: 0.1 10*3/uL (ref 0.0–0.7)
Eosinophils Relative: 2 % (ref 0–5)
HCT: 37.4 % (ref 36.0–46.0)
Hemoglobin: 12.6 g/dL (ref 12.0–15.0)
Lymphocytes Relative: 29 % (ref 12–46)
Lymphs Abs: 1.9 10*3/uL (ref 0.7–4.0)
MCH: 29.1 pg (ref 26.0–34.0)
MCHC: 33.7 g/dL (ref 30.0–36.0)
MCV: 86.4 fL (ref 78.0–100.0)
Monocytes Absolute: 0.5 10*3/uL (ref 0.1–1.0)
Monocytes Relative: 7 % (ref 3–12)
Neutro Abs: 4.2 10*3/uL (ref 1.7–7.7)
Neutrophils Relative %: 62 % (ref 43–77)
Platelets: 294 10*3/uL (ref 150–400)
RBC: 4.33 MIL/uL (ref 3.87–5.11)
RDW: 13.9 % (ref 11.5–15.5)
WBC: 6.8 10*3/uL (ref 4.0–10.5)

## 2013-05-04 LAB — LIPID PANEL
Cholesterol: 134 mg/dL (ref 0–200)
HDL: 31 mg/dL — ABNORMAL LOW
LDL Cholesterol: 89 mg/dL (ref 0–99)
Total CHOL/HDL Ratio: 4.3 ratio
Triglycerides: 72 mg/dL
VLDL: 14 mg/dL (ref 0–40)

## 2013-05-04 LAB — T4: T4, Total: 13.8 ug/dL — ABNORMAL HIGH (ref 5.0–12.5)

## 2013-05-04 LAB — PROTIME-INR
INR: 0.98 (ref <1.50)
Prothrombin Time: 12.9 seconds (ref 11.6–15.2)

## 2013-05-04 LAB — TSH: TSH: 1.675 u[IU]/mL (ref 0.350–4.500)

## 2013-05-07 LAB — H. PYLORI ANTIBODY, IGG: H Pylori IgG: <0.4 {ISR}

## 2013-06-15 ENCOUNTER — Other Ambulatory Visit (INDEPENDENT_AMBULATORY_CARE_PROVIDER_SITE_OTHER): Payer: Self-pay | Admitting: General Surgery

## 2013-06-28 ENCOUNTER — Ambulatory Visit (INDEPENDENT_AMBULATORY_CARE_PROVIDER_SITE_OTHER): Payer: BC Managed Care – PPO | Admitting: General Surgery

## 2013-07-10 ENCOUNTER — Inpatient Hospital Stay (HOSPITAL_COMMUNITY): Admit: 2013-07-10 | Payer: BC Managed Care – PPO | Admitting: General Surgery

## 2013-07-10 ENCOUNTER — Encounter (HOSPITAL_COMMUNITY): Payer: Self-pay

## 2013-07-10 SURGERY — LAPAROSCOPIC ROUX-EN-Y GASTRIC BYPASS WITH UPPER ENDOSCOPY
Anesthesia: General

## 2013-07-12 ENCOUNTER — Ambulatory Visit (INDEPENDENT_AMBULATORY_CARE_PROVIDER_SITE_OTHER): Payer: BC Managed Care – PPO | Admitting: Emergency Medicine

## 2013-07-12 VITALS — BP 126/82 | HR 67 | Temp 98.0°F | Resp 16 | Ht 68.25 in | Wt 339.4 lb

## 2013-07-12 DIAGNOSIS — G43909 Migraine, unspecified, not intractable, without status migrainosus: Secondary | ICD-10-CM

## 2013-07-12 MED ORDER — ONDANSETRON 4 MG PO TBDP
4.0000 mg | ORAL_TABLET | Freq: Once | ORAL | Status: AC
  Administered 2013-07-12: 4 mg via ORAL

## 2013-07-12 MED ORDER — KETOROLAC TROMETHAMINE 60 MG/2ML IM SOLN
60.0000 mg | Freq: Once | INTRAMUSCULAR | Status: AC
  Administered 2013-07-12: 60 mg via INTRAMUSCULAR

## 2013-07-12 NOTE — Patient Instructions (Signed)
Migraine Headache A migraine headache is an intense, throbbing pain on one or both sides of your head. A migraine can last for 30 minutes to several hours. CAUSES  The exact cause of a migraine headache is not always known. However, a migraine may be caused when nerves in the brain become irritated and release chemicals that cause inflammation. This causes pain. Certain things may also trigger migraines, such as:  Alcohol.  Smoking.  Stress.  Menstruation.  Aged cheeses.  Foods or drinks that contain nitrates, glutamate, aspartame, or tyramine.  Lack of sleep.  Chocolate.  Caffeine.  Hunger.  Physical exertion.  Fatigue.  Medicines used to treat chest pain (nitroglycerine), birth control pills, estrogen, and some blood pressure medicines. SIGNS AND SYMPTOMS  Pain on one or both sides of your head.  Pulsating or throbbing pain.  Severe pain that prevents daily activities.  Pain that is aggravated by any physical activity.  Nausea, vomiting, or both.  Dizziness.  Pain with exposure to bright lights, loud noises, or activity.  General sensitivity to bright lights, loud noises, or smells. Before you get a migraine, you may get warning signs that a migraine is coming (aura). An aura may include:  Seeing flashing lights.  Seeing bright spots, halos, or zig-zag lines.  Having tunnel vision or blurred vision.  Having feelings of numbness or tingling.  Having trouble talking.  Having muscle weakness. DIAGNOSIS  A migraine headache is often diagnosed based on:  Symptoms.  Physical exam.  A CT scan or MRI of your head. These imaging tests cannot diagnose migraines, but they can help rule out other causes of headaches. TREATMENT Medicines may be given for pain and nausea. Medicines can also be given to help prevent recurrent migraines.  HOME CARE INSTRUCTIONS  Only take over-the-counter or prescription medicines for pain or discomfort as directed by your  health care provider. The use of long-term narcotics is not recommended.  Lie down in a dark, quiet room when you have a migraine.  Keep a journal to find out what may trigger your migraine headaches. For example, write down:  What you eat and drink.  How much sleep you get.  Any change to your diet or medicines.  Limit alcohol consumption.  Quit smoking if you smoke.  Get 7 9 hours of sleep, or as recommended by your health care provider.  Limit stress.  Keep lights dim if bright lights bother you and make your migraines worse. SEEK IMMEDIATE MEDICAL CARE IF:   Your migraine becomes severe.  You have a fever.  You have a stiff neck.  You have vision loss.  You have muscular weakness or loss of muscle control.  You start losing your balance or have trouble walking.  You feel faint or pass out.  You have severe symptoms that are different from your first symptoms. MAKE SURE YOU:   Understand these instructions.  Will watch your condition.  Will get help right away if you are not doing well or get worse. Document Released: 04/05/2005 Document Revised: 01/24/2013 Document Reviewed: 12/11/2012 [employer] Patient Information 2014 [employer].  

## 2013-07-12 NOTE — Progress Notes (Signed)
Subjective:    Patient ID: Pamela Hunt, female    DOB: 07-13-1975, 38 y.o.   MRN: [id number] This chart was scribed for Pamela Gobble, MD by Valera Castle, ED Scribe. This patient was seen in room 13 and the patient's care was started at 11:24 AM.  Chief Complaint  Patient presents with  . Migraine    x 2 days has PMH of migraines   HPI Pamela Hunt is a 38 y.o. female Pt presents to the [facility] with a constant, burning, migraine, that starts at the back of her head and then wraps around to her bilateral forehead, onset 2 days ago. Pt has h/o migraines, states they usually come once a month with her menstrual periods. She reports her migraines usually last only a day. She states that this migraine has lasted 2 days, and has been more painful than usual, stating a 7/10 severity. She has associated blurry vision, which is consistent with her prior migraine symptoms. She also reports associated nausea. She reports taking migraine medication, states it doesn't absolve the pain completley, but usually helps take the edge off. She denies the medication helping with her recent migraine. She denies LE numbness and weakness, vomiting, and any other associated symptoms.   Pt was scheduled for a laparoscopy 2 days ago, but it was cancelled due to pt not being able to meet the cost up front. She denies this triggering her current migraine.   PCP - Frederich Chick, MD  Patient Active Problem List   Diagnosis Date Noted  . Morbid obesity with BMI of 50.0-59.9, adult 02/08/2013  . Hx of acid reflux disease 09/08/2011  . Hx of closely spaced pregnancy 09/08/2011  . Hx of asthma 09/08/2011  . Hx of increased body habitus 09/08/2011   Prior to Admission medications   Medication Sig Start Date End Date Taking? Authorizing Provider  albuterol (PROVENTIL HFA;VENTOLIN HFA) 108 (90 BASE) MCG/ACT inhaler Inhale 2 puffs into the lungs every 4 (four) hours as needed for wheezing (cough, shortness of breath  or wheezing.). 05/25/12  Yes Eleanore E Egan, PA-C  HYDROcodone-homatropine (HYCODAN) 5-1.5 MG/5ML syrup Take 5 mLs by mouth every 8 (eight) hours as needed for cough. 05/25/12  Yes Godfrey Pick, PA-C  albuterol (PROVENTIL HFA;VENTOLIN HFA) 108 (90 BASE) MCG/ACT inhaler Inhale 2 puffs into the lungs every 6 (six) hours as needed for wheezing. 11/08/11 11/07/12  Pearline Cables, MD   Review of Systems  Eyes: Positive for photophobia and visual disturbance (blurry).  Gastrointestinal: Positive for nausea. Negative for vomiting.  Neurological: Positive for headaches. Negative for weakness and numbness.   BP 154/94  Pulse 91  Temp(Src) 98 F (36.7 C) (Oral)  Resp 16  Ht 5' 8.25" (1.734 m)  Wt 339 lb 6.4 oz (153.951 kg)  BMI 51.20 kg/m2  SpO2 98%  LMP 07/12/2013     Objective:   Physical Exam  Nursing note and vitals reviewed. Constitutional: She is oriented to person, place, and time. She appears well-developed and well-nourished. No distress.  HENT:  Head: Normocephalic and atraumatic.  Eyes: EOM are normal.  Photophobia.  Neck: Neck supple.  Cardiovascular: Normal rate.   Pulmonary/Chest: Effort normal. No respiratory distress.  Musculoskeletal: Normal range of motion.  Neurological: She is alert and oriented to person, place, and time.  Skin: Skin is warm and dry.  Psychiatric: She has a normal mood and affect. Her behavior is normal.      Assessment & Plan:  Patient presents with migraines associated with her menstrual periods. She was given Zofran and 60 mg of Toradol. Patient tolerated treatment well. I personally performed the services described in this documentation, which was scribed in my presence. The recorded information has been reviewed and is accurate.

## 2013-07-25 ENCOUNTER — Ambulatory Visit (INDEPENDENT_AMBULATORY_CARE_PROVIDER_SITE_OTHER): Payer: BC Managed Care – PPO | Admitting: General Surgery

## 2013-10-16 LAB — OB RESULTS CONSOLE GC/CHLAMYDIA
Chlamydia: NEGATIVE
Gonorrhea: NEGATIVE

## 2013-10-16 LAB — OB RESULTS CONSOLE ABO/RH: RH Type: POSITIVE

## 2013-10-16 LAB — OB RESULTS CONSOLE RUBELLA ANTIBODY, IGM: RUBELLA: IMMUNE

## 2013-10-16 LAB — OB RESULTS CONSOLE ANTIBODY SCREEN: ANTIBODY SCREEN: NEGATIVE

## 2014-04-04 LAB — OB RESULTS CONSOLE HIV ANTIBODY (ROUTINE TESTING): HIV: NONREACTIVE

## 2014-04-04 LAB — OB RESULTS CONSOLE RPR: RPR: NONREACTIVE

## 2014-04-19 HISTORY — PX: GASTRIC BYPASS: SHX52

## 2014-04-19 NOTE — L&D Delivery Note (Signed)
1346: Call from nurse reporting patient with desire to push.  In room to assess and patient C/C/+4. FHR remained reassuring and patient encouraged to push with contractions.  Patient delivered as below.   Delivery Note At 1:56 PM a viable female "Ninfa Linden" was delivered via Vaginal, Spontaneous Delivery (Presentation: Left Occiput Anterior).  Double nuchal cord noted after delivery of head and infant somersaulted through.  Shoulders delivered easily and infant with good tone and spontaneous cry. Tactile stimulation given by provider and infant placed on mother's abdomen where nurse continued tactile stimulation. Infant APGARs: 9, 9. Cord clamped, cut, and blood collected. Placenta delivered spontaneously and noted to be intact with 3VC upon inspection.  Vaginal inspection revealed no lacerations.  Fundus firm, at the umbilicus, and bleeding small.  Mother hemodynamically stable and infant skin to skin prior to provider exit.  Mother desires nexplanon for birth control method and opts to breastfeed.  Family wishes for infant to be circumcised during inpatient stay.  Infant weight at one hour of life: 8lbs 5.7oz, 22in  Anesthesia: Epidural  Episiotomy: None Lacerations: None Suture Repair: None Est. Blood Loss (mL): 200  Mom to postpartum.  Baby to Couplet care / Skin to Skin.  Mack Alvidrez LYNN MSN, CNM 06/10/2014, 2:20 PM

## 2014-05-23 LAB — OB RESULTS CONSOLE GBS: GBS: POSITIVE

## 2014-06-06 ENCOUNTER — Inpatient Hospital Stay (HOSPITAL_COMMUNITY)
Admission: AD | Admit: 2014-06-06 | Discharge: 2014-06-06 | Disposition: A | Discharge status: Home / Self Care | Payer: BC Managed Care – PPO | Source: Ambulatory Visit | Attending: Obstetrics and Gynecology | Admitting: Obstetrics and Gynecology

## 2014-06-06 ENCOUNTER — Encounter (HOSPITAL_COMMUNITY): Payer: Self-pay | Admitting: *Deleted

## 2014-06-06 DIAGNOSIS — O99213 Obesity complicating pregnancy, third trimester: Secondary | ICD-10-CM | POA: Insufficient documentation

## 2014-06-06 DIAGNOSIS — R030 Elevated blood-pressure reading, without diagnosis of hypertension: Secondary | ICD-10-CM | POA: Insufficient documentation

## 2014-06-06 DIAGNOSIS — Z3A38 38 weeks gestation of pregnancy: Secondary | ICD-10-CM | POA: Insufficient documentation

## 2014-06-06 DIAGNOSIS — Z6841 Body Mass Index (BMI) 40.0 and over, adult: Secondary | ICD-10-CM | POA: Insufficient documentation

## 2014-06-06 DIAGNOSIS — R600 Localized edema: Secondary | ICD-10-CM | POA: Diagnosis not present

## 2014-06-06 DIAGNOSIS — Z3493 Encounter for supervision of normal pregnancy, unspecified, third trimester: Secondary | ICD-10-CM

## 2014-06-06 DIAGNOSIS — IMO0001 Reserved for inherently not codable concepts without codable children: Secondary | ICD-10-CM | POA: Diagnosis present

## 2014-06-06 DIAGNOSIS — O9989 Other specified diseases and conditions complicating pregnancy, childbirth and the puerperium: Secondary | ICD-10-CM | POA: Insufficient documentation

## 2014-06-06 LAB — COMPREHENSIVE METABOLIC PANEL
ALBUMIN: 2.4 g/dL — AB (ref 3.5–5.2)
ALT: 23 U/L (ref 0–35)
ANION GAP: 10 (ref 5–15)
AST: 29 U/L (ref 0–37)
Alkaline Phosphatase: 201 U/L — ABNORMAL HIGH (ref 39–117)
BILIRUBIN TOTAL: 0.6 mg/dL (ref 0.3–1.2)
BUN: <5 mg/dL — ABNORMAL LOW (ref 6–23)
CO2: 21 mmol/L (ref 19–32)
Calcium: 8.7 mg/dL (ref 8.4–10.5)
Chloride: 105 mmol/L (ref 96–112)
Creatinine, Ser: 0.96 mg/dL (ref 0.50–1.10)
GFR calc non Af Amer: 75 mL/min — ABNORMAL LOW (ref >90)
GFR, EST AFRICAN AMERICAN: 87 mL/min — AB (ref >90)
Glucose, Bld: 146 mg/dL — ABNORMAL HIGH (ref 70–99)
Potassium: 3.8 mmol/L (ref 3.5–5.1)
SODIUM: 136 mmol/L (ref 135–145)
TOTAL PROTEIN: 6.4 g/dL (ref 6.0–8.3)

## 2014-06-06 LAB — CBC
HCT: 34.2 % — ABNORMAL LOW (ref 36.0–46.0)
Hemoglobin: 11.3 g/dL — ABNORMAL LOW (ref 12.0–15.0)
MCH: 29.4 pg (ref 26.0–34.0)
MCHC: 33 g/dL (ref 30.0–36.0)
MCV: 89.1 fL (ref 78.0–100.0)
PLATELETS: 254 10*3/uL (ref 150–400)
RBC: 3.84 MIL/uL — AB (ref 3.87–5.11)
RDW: 15.5 % (ref 11.5–15.5)
WBC: 7.9 10*3/uL (ref 4.0–10.5)

## 2014-06-06 LAB — LACTATE DEHYDROGENASE: LDH: 135 U/L (ref 94–250)

## 2014-06-06 LAB — PROTEIN / CREATININE RATIO, URINE
Creatinine, Urine: 61 mg/dL
Protein Creatinine Ratio: 0.1 (ref 0.00–0.15)
Total Protein, Urine: 6 mg/dL

## 2014-06-06 LAB — URIC ACID: Uric Acid, Serum: 6.8 mg/dL (ref 2.4–7.0)

## 2014-06-06 MED ORDER — BUTALBITAL-APAP-CAFFEINE 50-325-40 MG PO TABS
2.0000 | ORAL_TABLET | Freq: Once | ORAL | Status: AC
  Administered 2014-06-06: 2 via ORAL
  Filled 2014-06-06: qty 2

## 2014-06-06 MED ORDER — LABETALOL HCL 5 MG/ML IV SOLN: 10.0000 mg | INTRAVENOUS | Status: DC | PRN

## 2014-06-06 NOTE — Progress Notes (Signed)
Pt states discharge has increased

## 2014-06-06 NOTE — MAU Note (Signed)
Pt states her headache started about 1300. Pt states she has blurred vision from being dizzy and refocusing

## 2014-06-06 NOTE — MAU Provider Note (Signed)
History    Pamela Hunt is a 39 y.o. G3P2002 at 38.1wks who presents, from the office, for elevated bp.  Patient reports she had a bp of 150s/90s in the office and she also is having headache, blurry vision, and bilateral pedial edema.  Patient states HA has been ongoing for about a week and unresponsive to tylenol.  Patient also reports pedial edema started last week and has made ambulation difficult.  Patient also reports increased thirst and reports drinking "over 64oz of water a day."  Patient admits to caffeine usage and drinks a "16oz coke daily."  Patient reports active fetus and intermittent contractions,  While denying LOF, VB, and change in vaginal discharge.  Patient denies history of HTN in adulthood or during previous pregnancies.    Patient Active Problem List   Diagnosis Date Noted  . Morbid obesity with BMI of 50.0-59.9, adult 02/08/2013  . Hx of acid reflux disease 09/08/2011  . Hx of closely spaced pregnancy 09/08/2011  . Hx of asthma 09/08/2011  . Hx of increased body habitus 09/08/2011    No chief complaint on file.  HPI  OB History    Gravida Para Term Preterm AB TAB SAB Ectopic Multiple Living   2 2 2       2       Past Medical History  Diagnosis Date  . Headache(784.0)     migraines  . Asthma   . Abnormal Pap smear   . Pneumonia     Had as a child  . GERD (gastroesophageal reflux disease)     No past surgical history on file.  Family History  Problem Relation Age of Onset  . Thyroid nodules Mother   . Thyroid nodules Father   . Arthritis Brother   . Thyroid nodules Maternal Grandmother   . Cancer Paternal Grandmother   . Heart disease Paternal Grandfather     History  Substance Use Topics  . Smoking status: Never Smoker   . Smokeless tobacco: Not on file  . Alcohol Use: Yes     Comment: socially     Allergies:  Allergies  Allergen Reactions  . Tetracyclines & Related   . Tomato     Prescriptions prior to admission  Medication Sig  Dispense Refill Last Dose  . albuterol (PROVENTIL HFA;VENTOLIN HFA) 108 (90 BASE) MCG/ACT inhaler Inhale 2 puffs into the lungs every 6 (six) hours as needed for wheezing. 1 Inhaler 4 Taking  . albuterol (PROVENTIL HFA;VENTOLIN HFA) 108 (90 BASE) MCG/ACT inhaler Inhale 2 puffs into the lungs every 4 (four) hours as needed for wheezing (cough, shortness of breath or wheezing.). 1 Inhaler 3 Taking  . HYDROcodone-homatropine (HYCODAN) 5-1.5 MG/5ML syrup Take 5 mLs by mouth every 8 (eight) hours as needed for cough. 120 mL 0 Taking    ROS  See HPI Above Physical Exam   Filed Vitals:   06/06/14 1606 06/06/14 1621 06/06/14 1636 06/06/14 1651  BP: 126/69 129/66 132/72 133/69  Pulse: 105 103 103 109  Temp:      TempSrc:      Resp:        Results for orders placed or performed during the hospital encounter of 06/06/14 (from the past 24 hour(s))  CBC     Status: Abnormal   Collection Time: 06/06/14  3:02 PM  Result Value Ref Range   WBC 7.9 4.0 - 10.5 K/uL   RBC 3.84 (L) 3.87 - 5.11 MIL/uL   Hemoglobin 11.3 (L) 12.0 - 15.0 g/dL  HCT 34.2 (L) 36.0 - 46.0 %   MCV 89.1 78.0 - 100.0 fL   MCH 29.4 26.0 - 34.0 pg   MCHC 33.0 30.0 - 36.0 g/dL   RDW 28.3 66.2 - 94.7 %   Platelets 254 150 - 400 K/uL  Comprehensive metabolic panel     Status: Abnormal   Collection Time: 06/06/14  3:02 PM  Result Value Ref Range   Sodium 136 135 - 145 mmol/L   Potassium 3.8 3.5 - 5.1 mmol/L   Chloride 105 96 - 112 mmol/L   CO2 21 19 - 32 mmol/L   Glucose, Bld 146 (H) 70 - 99 mg/dL   BUN <5 (L) 6 - 23 mg/dL   Creatinine, Ser 6.54 0.50 - 1.10 mg/dL   Calcium 8.7 8.4 - 65.0 mg/dL   Total Protein 6.4 6.0 - 8.3 g/dL   Albumin 2.4 (L) 3.5 - 5.2 g/dL   AST 29 0 - 37 U/L   ALT 23 0 - 35 U/L   Alkaline Phosphatase 201 (H) 39 - 117 U/L   Total Bilirubin 0.6 0.3 - 1.2 mg/dL   GFR calc non Af Amer 75 (L) >90 mL/min   GFR calc Af Amer 87 (L) >90 mL/min   Anion gap 10 5 - 15  Lactate dehydrogenase     Status: None    Collection Time: 06/06/14  3:02 PM  Result Value Ref Range   LDH 135 94 - 250 U/L  Uric acid     Status: None   Collection Time: 06/06/14  3:02 PM  Result Value Ref Range   Uric Acid, Serum 6.8 2.4 - 7.0 mg/dL  Protein / creatinine ratio, urine     Status: None   Collection Time: 06/06/14  3:20 PM  Result Value Ref Range   Creatinine, Urine 61.00 mg/dL   Total Protein, Urine 6 mg/dL   Protein Creatinine Ratio 0.10 0.00 - 0.15    Physical Exam  Constitutional: She is oriented to person, place, and time. She is cooperative. No distress.  Morbid Obesity  HENT:  Head: Normocephalic and atraumatic.  Eyes: EOM are normal.  Neck: Normal range of motion.  Cardiovascular: Normal rate, regular rhythm and normal heart sounds.   Respiratory: Effort normal and breath sounds normal.  GI: Soft. Bowel sounds are normal.  Musculoskeletal: Normal range of motion.       Right ankle: She exhibits swelling (.+4 pitting edema ).       Left ankle: She exhibits swelling (. +4 pitting edema).       Right foot: There is swelling.       Left foot: There is swelling.  Neurological: She is alert and oriented to person, place, and time.  Skin: Skin is warm and dry.   FHR: 135 bpm, Mod Var, -Decels, +Accels UC: Occasional mild ED Course  Assessment: IUP at 38.2wks Cat I FT Elevated BP in Office HA Pedial Edema  Plan: -PE as above -PIH Labs -Fiorcet for HA -Dr. Audree Camel updated on patient status  Follow Up (1750) -Labs WNL -Patient reports decrease in HA from 5/10 to 2/10 -Reports some irregular moderate contractions, but desires unmedicated birth -Labor Precautions---Who and when to call for pregnancy related concerns -Discussed relief measures for pedial edema including elevation, heat/cold, massage, increased water  -Keep appt as scheduled if not delivered: 06/13/14 -Encouraged to call if any questions or concerns arise prior to next scheduled office visit.  -Discharged to home in  early labor  [facility],  Khadar Monger LYNN CNM, MSN 06/06/2014 3:00 PM

## 2014-06-06 NOTE — Discharge Instructions (Signed)
Edema °Edema is an abnormal buildup of fluids in your body tissues. Edema is somewhat dependent on gravity to pull the fluid to the lowest place in your body. That makes the condition more common in the legs and thighs (lower extremities). Painless swelling of the feet and ankles is common and becomes more likely as you get older. It is also common in looser tissues, like around your eyes.  °When the affected area is squeezed, the fluid may move out of that spot and leave a dent for a few moments. This dent is called pitting.  °CAUSES  °There are many possible causes of edema. Eating too much salt and being on your feet or sitting for a long time can cause edema in your legs and ankles. Hot weather may make edema worse. Common medical causes of edema include: °· Heart failure. °· Liver disease. °· Kidney disease. °· Weak blood vessels in your legs. °· Cancer. °· An injury. °· Pregnancy. °· Some medications. °· Obesity.  °SYMPTOMS  °Edema is usually painless. Your skin may look swollen or shiny.  °DIAGNOSIS  °Your health care provider may be able to diagnose edema by asking about your medical history and doing a physical exam. You may need to have tests such as X-rays, an electrocardiogram, or blood tests to check for medical conditions that may cause edema.  °TREATMENT  °Edema treatment depends on the cause. If you have heart, liver, or kidney disease, you need the treatment appropriate for these conditions. General treatment may include: °· Elevation of the affected body part above the level of your heart. °· Compression of the affected body part. Pressure from elastic bandages or support stockings squeezes the tissues and forces fluid back into the blood vessels. This keeps fluid from entering the tissues. °· Restriction of fluid and salt intake. °· Use of a water pill (diuretic). These medications are appropriate only for some types of edema. They pull fluid out of your body and make you urinate more often. This  gets rid of fluid and reduces swelling, but diuretics can have side effects. Only use diuretics as directed by your health care provider. °HOME CARE INSTRUCTIONS  °· Keep the affected body part above the level of your heart when you are lying down.   °· Do not sit still or stand for prolonged periods.   °· Do not put anything directly under your knees when lying down. °· Do not wear constricting clothing or garters on your upper legs.   °· Exercise your legs to work the fluid back into your blood vessels. This may help the swelling go down.   °· Wear elastic bandages or support stockings to reduce ankle swelling as directed by your health care provider.   °· Eat a low-salt diet to reduce fluid if your health care provider recommends it.   °· Only take medicines as directed by your health care provider.  °SEEK MEDICAL CARE IF:  °· Your edema is not responding to treatment. °· You have heart, liver, or kidney disease and notice symptoms of edema. °· You have edema in your legs that does not improve after elevating them.   °· You have sudden and unexplained weight gain. °SEEK IMMEDIATE MEDICAL CARE IF:  °· You develop shortness of breath or chest pain.   °· You cannot breathe when you lie down. °· You develop pain, redness, or warmth in the swollen areas.   °· You have heart, liver, or kidney disease and suddenly get edema. °· You have a fever and your symptoms suddenly get worse. °MAKE SURE YOU:  °·   Understand these instructions.  Will watch your condition.  Will get help right away if you are not doing well or get worse. Document Released: 04/05/2005 Document Revised: 08/20/2013 Document Reviewed: 01/26/2013 [facility] Patient Information 2015 [place], [place]. This information is not intended to replace advice given to you by your health care provider. Make sure you discuss any questions you have with your health care provider. Hypertension During Pregnancy Hypertension, or high blood pressure, is when there is  extra pressure inside your blood vessels that carry blood from the heart to the rest of your body (arteries). It can happen at any time in life, including pregnancy. Hypertension during pregnancy can cause problems for you and your baby. Your baby might not weigh as much as he or she should at birth or might be born early (premature). Very bad cases of hypertension during pregnancy can be life-threatening.  Different types of hypertension can occur during pregnancy. These include:  Chronic hypertension. This happens when a woman has hypertension before pregnancy and it continues during pregnancy.  Gestational hypertension. This is when hypertension develops during pregnancy.  Preeclampsia or toxemia of pregnancy. This is a very serious type of hypertension that develops only during pregnancy. It affects the whole body and can be very dangerous for both mother and baby.  Gestational hypertension and preeclampsia usually go away after your baby is born. Your blood pressure will likely stabilize within 6 weeks. Women who have hypertension during pregnancy have a greater chance of developing hypertension later in life or with future pregnancies. RISK FACTORS There are certain factors that make it more likely for you to develop hypertension during pregnancy. These include:  Having hypertension before pregnancy.  Having hypertension during a previous pregnancy.  Being overweight.  Being older than 40 years.  Being pregnant with more than one baby.  Having diabetes or kidney problems. SIGNS AND SYMPTOMS Chronic and gestational hypertension rarely cause symptoms. Preeclampsia has symptoms, which may include:  Increased protein in your urine. Your health care provider will check for this at every prenatal visit.  Swelling of your hands and face.  Rapid weight gain.  Headaches.  Visual changes.  Being bothered by light.  Abdominal pain, especially in the upper right area.  Chest  pain.  Shortness of breath.  Increased reflexes.  Seizures. These occur with a more severe form of preeclampsia, called eclampsia. DIAGNOSIS  You may be diagnosed with hypertension during a regular prenatal exam. At each prenatal visit, you may have:  Your blood pressure checked.  A urine test to check for protein in your urine. The type of hypertension you are diagnosed with depends on when you developed it. It also depends on your specific blood pressure reading.  Developing hypertension before 20 weeks of pregnancy is consistent with chronic hypertension.  Developing hypertension after 20 weeks of pregnancy is consistent with gestational hypertension.  Hypertension with increased urinary protein is diagnosed as preeclampsia.  Blood pressure measurements that stay above 160 systolic or 110 diastolic are a sign of severe preeclampsia. TREATMENT Treatment for hypertension during pregnancy varies. Treatment depends on the type of hypertension and how serious it is.  If you take medicine for chronic hypertension, you may need to switch medicines.  Medicines called ACE inhibitors should not be taken during pregnancy.  Low-dose aspirin may be suggested for women who have risk factors for preeclampsia.  If you have gestational hypertension, you may need to take a blood pressure medicine that is safe during pregnancy. Your health  care provider will recommend the correct medicine.  If you have severe preeclampsia, you may need to be in the hospital. Health care providers will watch you and your baby very closely. You also may need to take medicine called magnesium sulfate to prevent seizures and lower blood pressure.  Sometimes, an early delivery is needed. This may be the case if the condition worsens. It would be done to protect you and your baby. The only cure for preeclampsia is delivery.  Your health care provider may recommend that you take one low-dose aspirin (81 mg) each day to  help prevent high blood pressure during your pregnancy if you are at risk for preeclampsia. You may be at risk for preeclampsia if:  You had preeclampsia or eclampsia during a previous pregnancy.  Your baby did not grow as expected during a previous pregnancy.  You experienced preterm birth with a previous pregnancy.  You experienced a separation of the placenta from the uterus (placental abruption) during a previous pregnancy.  You experienced the loss of your baby during a previous pregnancy.  You are pregnant with more than one baby.  You have other medical conditions, such as diabetes or an autoimmune disease. HOME CARE INSTRUCTIONS  Schedule and keep all of your regular prenatal care appointments. This is important.  Take medicines only as directed by your health care provider. Tell your health care provider about all medicines you take.  Eat as little salt as possible.  Get regular exercise.  Do not drink alcohol.  Do not use tobacco products.  Do not drink products with caffeine.  Lie on your left side when resting. SEEK IMMEDIATE MEDICAL CARE IF:  You have severe abdominal pain.  You have sudden swelling in your hands, ankles, or face.  You gain 4 pounds (1.8 kg) or more in 1 week.  You vomit repeatedly.  You have vaginal bleeding.  You do not feel your baby moving as much.  You have a headache.  You have blurred or double vision.  You have muscle twitching or spasms.  You have shortness of breath.  You have blue fingernails or lips.  You have blood in your urine. MAKE SURE YOU:  Understand these instructions.  Will watch your condition.  Will get help right away if you are not doing well or get worse. Document Released: 12/22/2010 Document Revised: 08/20/2013 Document Reviewed: 11/02/2012 [facility] Patient Information 2015 [place], [place]. This information is not intended to replace advice given to you by your health care provider. Make sure  you discuss any questions you have with your health care provider.

## 2014-06-09 ENCOUNTER — Inpatient Hospital Stay (HOSPITAL_COMMUNITY)
Admission: AD | Admit: 2014-06-09 | Discharge: 2014-06-12 | DRG: 775 | Disposition: A | Discharge status: Home / Self Care | Payer: BC Managed Care – PPO | Source: Ambulatory Visit | Attending: Obstetrics and Gynecology | Admitting: Obstetrics and Gynecology

## 2014-06-09 ENCOUNTER — Encounter (HOSPITAL_COMMUNITY): Payer: Self-pay | Admitting: *Deleted

## 2014-06-09 DIAGNOSIS — O09523 Supervision of elderly multigravida, third trimester: Secondary | ICD-10-CM | POA: Diagnosis present

## 2014-06-09 DIAGNOSIS — Z6841 Body Mass Index (BMI) 40.0 and over, adult: Secondary | ICD-10-CM

## 2014-06-09 DIAGNOSIS — O99824 Streptococcus B carrier state complicating childbirth: Secondary | ICD-10-CM | POA: Diagnosis present

## 2014-06-09 DIAGNOSIS — Z3A38 38 weeks gestation of pregnancy: Secondary | ICD-10-CM | POA: Diagnosis present

## 2014-06-09 DIAGNOSIS — O10919 Unspecified pre-existing hypertension complicating pregnancy, unspecified trimester: Secondary | ICD-10-CM | POA: Diagnosis present

## 2014-06-09 DIAGNOSIS — O99214 Obesity complicating childbirth: Secondary | ICD-10-CM | POA: Diagnosis present

## 2014-06-09 DIAGNOSIS — R600 Localized edema: Secondary | ICD-10-CM | POA: Diagnosis present

## 2014-06-09 DIAGNOSIS — O133 Gestational [pregnancy-induced] hypertension without significant proteinuria, third trimester: Principal | ICD-10-CM | POA: Diagnosis present

## 2014-06-09 HISTORY — DX: Gestational (pregnancy-induced) hypertension without significant proteinuria, unspecified trimester: O13.9

## 2014-06-09 LAB — COMPREHENSIVE METABOLIC PANEL
ALT: 28 U/L (ref 0–35)
ANION GAP: 4 — AB (ref 5–15)
AST: 43 U/L — AB (ref 0–37)
Albumin: 2.2 g/dL — ABNORMAL LOW (ref 3.5–5.2)
Alkaline Phosphatase: 200 U/L — ABNORMAL HIGH (ref 39–117)
BILIRUBIN TOTAL: 0.3 mg/dL (ref 0.3–1.2)
BUN: 6 mg/dL (ref 6–23)
CALCIUM: 8.4 mg/dL (ref 8.4–10.5)
CHLORIDE: 107 mmol/L (ref 96–112)
CO2: 23 mmol/L (ref 19–32)
Creatinine, Ser: 0.87 mg/dL (ref 0.50–1.10)
GFR, EST NON AFRICAN AMERICAN: 85 mL/min — AB (ref >90)
Glucose, Bld: 121 mg/dL — ABNORMAL HIGH (ref 70–99)
Potassium: 3.8 mmol/L (ref 3.5–5.1)
Sodium: 134 mmol/L — ABNORMAL LOW (ref 135–145)
Total Protein: 6.2 g/dL (ref 6.0–8.3)

## 2014-06-09 LAB — URINALYSIS, ROUTINE W REFLEX MICROSCOPIC
Bilirubin Urine: NEGATIVE
GLUCOSE, UA: NEGATIVE mg/dL
KETONES UR: NEGATIVE mg/dL
Nitrite: NEGATIVE
PH: 6.5 (ref 5.0–8.0)
Protein, ur: NEGATIVE mg/dL
Specific Gravity, Urine: <1.005 — ABNORMAL LOW (ref 1.005–1.030)
UROBILINOGEN UA: 0.2 mg/dL (ref 0.0–1.0)

## 2014-06-09 LAB — PROTEIN / CREATININE RATIO, URINE
CREATININE, URINE: 47 mg/dL
PROTEIN CREATININE RATIO: 0.15 (ref 0.00–0.15)
Total Protein, Urine: 7 mg/dL

## 2014-06-09 LAB — CBC WITH DIFFERENTIAL/PLATELET
BASOS ABS: 0 10*3/uL (ref 0.0–0.1)
Basophils Relative: 0 % (ref 0–1)
Eosinophils Absolute: 0.1 10*3/uL (ref 0.0–0.7)
Eosinophils Relative: 1 % (ref 0–5)
HEMATOCRIT: 34 % — AB (ref 36.0–46.0)
Hemoglobin: 11 g/dL — ABNORMAL LOW (ref 12.0–15.0)
LYMPHS PCT: 22 % (ref 12–46)
Lymphs Abs: 1.7 10*3/uL (ref 0.7–4.0)
MCH: 28.9 pg (ref 26.0–34.0)
MCHC: 32.4 g/dL (ref 30.0–36.0)
MCV: 89.2 fL (ref 78.0–100.0)
MONO ABS: 1 10*3/uL (ref 0.1–1.0)
MONOS PCT: 12 % (ref 3–12)
NEUTROS ABS: 5 10*3/uL (ref 1.7–7.7)
Neutrophils Relative %: 65 % (ref 43–77)
Platelets: 268 10*3/uL (ref 150–400)
RBC: 3.81 MIL/uL — ABNORMAL LOW (ref 3.87–5.11)
RDW: 15.6 % — ABNORMAL HIGH (ref 11.5–15.5)
WBC: 7.8 10*3/uL (ref 4.0–10.5)

## 2014-06-09 LAB — URINE MICROSCOPIC-ADD ON

## 2014-06-09 LAB — LACTATE DEHYDROGENASE: LDH: 153 U/L (ref 94–250)

## 2014-06-09 LAB — URIC ACID: URIC ACID, SERUM: 6.4 mg/dL (ref 2.4–7.0)

## 2014-06-09 NOTE — MAU Note (Signed)
Pt presents complaining of contractions every 8 minutes with severe back pain. Denies vaginal bleeding, leaking or discharge. Reports good fetal movement. BP has been getting higher the last few weeks at the office. Increase in swelling

## 2014-06-09 NOTE — MAU Note (Signed)
Pamela Hunt is a 39 y.o. G3P2002 at 38.4 weeks  Pt presents complaining of contractions every 8 minutes with severe back pain. Denies vaginal bleeding, leaking or discharge. Reports good fetal movement. BP has been getting higher the last few weeks at the office. Increase in swelling.  Pt complaining of headache and blurry vision.  History     Patient Active Problem List   Diagnosis Date Noted  . Elevated BP 06/06/2014  . Third trimester pregnancy 06/06/2014  . Morbid obesity with BMI of 50.0-59.9, adult 02/08/2013  . Hx of acid reflux disease 09/08/2011  . Hx of closely spaced pregnancy 09/08/2011  . Hx of asthma 09/08/2011  . Hx of increased body habitus 09/08/2011    Chief Complaint  Patient presents with  . Labor Eval   HPI  OB History    Gravida Para Term Preterm AB TAB SAB Ectopic Multiple Living   3 2 2       2       Past Medical History  Diagnosis Date  . Headache(784.0)     migraines  . Asthma   . Abnormal Pap smear   . Pneumonia     Had as a child  . GERD (gastroesophageal reflux disease)   . Pregnancy induced hypertension     Past Surgical History  Procedure Laterality Date  . Wisdom tooth extraction      Family History  Problem Relation Age of Onset  . Thyroid nodules Mother   . Thyroid nodules Father   . Arthritis Brother   . Thyroid nodules Maternal Grandmother   . Cancer Paternal Grandmother   . Heart disease Paternal Grandfather     History  Substance Use Topics  . Smoking status: Never Smoker   . Smokeless tobacco: Not on file  . Alcohol Use: Yes     Comment: socially     Allergies:  Allergies  Allergen Reactions  . Tetracyclines & Related Anaphylaxis  . Tomato Itching and Swelling    Prescriptions prior to admission  Medication Sig Dispense Refill Last Dose  . acetaminophen (TYLENOL) 500 MG tablet Take 1,000 mg by mouth every 6 (six) hours as needed for mild pain.   06/09/2014 at Unknown time  . albuterol (PROVENTIL  HFA;VENTOLIN HFA) 108 (90 BASE) MCG/ACT inhaler Inhale 2 puffs into the lungs every 4 (four) hours as needed for wheezing (cough, shortness of breath or wheezing.). 1 Inhaler 3 rescue  . calcium carbonate (TUMS - DOSED IN MG ELEMENTAL CALCIUM) 500 MG chewable tablet Chew 2 tablets by mouth 4 (four) times daily as needed for indigestion or heartburn.   06/09/2014 at Unknown time  . Lidocaine-Hydrocortisone Ace (ANA-LEX RE) Place 1 application rectally as needed (itching and pain).   Past Week at Unknown time  . Prenatal Vit-Fe Fumarate-FA (PRENATAL MULTIVITAMIN) TABS tablet Take 1 tablet by mouth daily at 12 noon.   06/08/2014 at Unknown time    ROS See HPI above, all other systems are negative  Physical Exam   Blood pressure 152/85, pulse 101, temperature 98 F (36.7 C), temperature source Oral, resp. rate 18, last menstrual period 07/12/2013.  Physical Exam Ext:  +3 edema ABD: Soft, non tender to palpation, no rebound or guarding SVE: ft-1/50/-3 soft mid position    ED Course  Assessment: IUP at  38.4weeks Membranes: intact FHR: Category 1 CTX:  occasional  Plan:  Labs: CBC, LDH, CMP,uric acid, PCR Dr 07/14/2013 Timmie Calix, CNM, MSN 06/09/2014. 9:52 PM

## 2014-06-09 NOTE — MAU Note (Signed)
Pt complaining of headache and blurry vision

## 2014-06-09 NOTE — MAU Provider Note (Signed)
MAU Addendum Note  Results for orders placed or performed during the hospital encounter of 06/09/14 (from the past 24 hour(s))  Urinalysis, Routine w reflex microscopic     Status: Abnormal   Collection Time: 06/09/14  8:15 PM  Result Value Ref Range   Color, Urine YELLOW YELLOW   APPearance CLEAR CLEAR   Specific Gravity, Urine <1.005 (L) 1.005 - 1.030   pH 6.5 5.0 - 8.0   Glucose, UA NEGATIVE NEGATIVE mg/dL   Hgb urine dipstick TRACE (A) NEGATIVE   Bilirubin Urine NEGATIVE NEGATIVE   Ketones, ur NEGATIVE NEGATIVE mg/dL   Protein, ur NEGATIVE NEGATIVE mg/dL   Urobilinogen, UA 0.2 0.0 - 1.0 mg/dL   Nitrite NEGATIVE NEGATIVE   Leukocytes, UA MODERATE (A) NEGATIVE  Urine microscopic-add on     Status: Abnormal   Collection Time: 06/09/14  8:15 PM  Result Value Ref Range   Squamous Epithelial / LPF FEW (A) RARE   WBC, UA 7-10 <3 WBC/hpf   RBC / HPF 3-6 <3 RBC/hpf   Bacteria, UA FEW (A) RARE  Protein / creatinine ratio, urine     Status: None   Collection Time: 06/09/14  8:15 PM  Result Value Ref Range   Creatinine, Urine 47.00 mg/dL   Total Protein, Urine 7 mg/dL   Protein Creatinine Ratio 0.15 0.00 - 0.15  Uric acid     Status: None   Collection Time: 06/09/14 10:05 PM  Result Value Ref Range   Uric Acid, Serum 6.4 2.4 - 7.0 mg/dL  Lactate dehydrogenase     Status: None   Collection Time: 06/09/14 10:05 PM  Result Value Ref Range   LDH 153 94 - 250 U/L  Comprehensive metabolic panel     Status: Abnormal   Collection Time: 06/09/14 10:05 PM  Result Value Ref Range   Sodium 134 (L) 135 - 145 mmol/L   Potassium 3.8 3.5 - 5.1 mmol/L   Chloride 107 96 - 112 mmol/L   CO2 23 19 - 32 mmol/L   Glucose, Bld 121 (H) 70 - 99 mg/dL   BUN 6 6 - 23 mg/dL   Creatinine, Ser 3.23 0.50 - 1.10 mg/dL   Calcium 8.4 8.4 - 55.7 mg/dL   Total Protein 6.2 6.0 - 8.3 g/dL   Albumin 2.2 (L) 3.5 - 5.2 g/dL   AST 43 (H) 0 - 37 U/L   ALT 28 0 - 35 U/L   Alkaline Phosphatase 200 (H) 39 - 117 U/L    Total Bilirubin 0.3 0.3 - 1.2 mg/dL   GFR calc non Af Amer 85 (L) >90 mL/min   GFR calc Af Amer >90 >90 mL/min   Anion gap 4 (L) 5 - 15  CBC with Differential     Status: Abnormal   Collection Time: 06/09/14 10:05 PM  Result Value Ref Range   WBC 7.8 4.0 - 10.5 K/uL   RBC 3.81 (L) 3.87 - 5.11 MIL/uL   Hemoglobin 11.0 (L) 12.0 - 15.0 g/dL   HCT 32.2 (L) 02.5 - 42.7 %   MCV 89.2 78.0 - 100.0 fL   MCH 28.9 26.0 - 34.0 pg   MCHC 32.4 30.0 - 36.0 g/dL   RDW 06.2 (H) 37.6 - 28.3 %   Platelets 268 150 - 400 K/uL   Neutrophils Relative % 65 43 - 77 %   Neutro Abs 5.0 1.7 - 7.7 K/uL   Lymphocytes Relative 22 12 - 46 %   Lymphs Abs 1.7 0.7 - 4.0 K/uL  Monocytes Relative 12 3 - 12 %   Monocytes Absolute 1.0 0.1 - 1.0 K/uL   Eosinophils Relative 1 0 - 5 %   Eosinophils Absolute 0.1 0.0 - 0.7 K/uL   Basophils Relative 0 0 - 1 %   Basophils Absolute 0.0 0.0 - 0.1 K/uL   Filed Vitals:   06/09/14 2246 06/09/14 2333 06/10/14 0028 06/10/14 0105  BP: 143/70 154/89 135/77   Pulse: 98 105 104   Temp:   98.6 F (37 C)   TempSrc:   Oral   Resp:   18   Height:    5\' 7"  (1.702 m)  Weight:    356 lb (161.481 kg)    Plan: Admit to L&D   Haylen Bellotti, CNM, MSN 06/09/2014. 11:19 PM

## 2014-06-10 ENCOUNTER — Inpatient Hospital Stay (HOSPITAL_COMMUNITY): Payer: BC Managed Care – PPO | Admitting: Anesthesiology

## 2014-06-10 ENCOUNTER — Encounter (HOSPITAL_COMMUNITY): Payer: Self-pay | Admitting: *Deleted

## 2014-06-10 DIAGNOSIS — O10919 Unspecified pre-existing hypertension complicating pregnancy, unspecified trimester: Secondary | ICD-10-CM | POA: Diagnosis present

## 2014-06-10 DIAGNOSIS — O133 Gestational [pregnancy-induced] hypertension without significant proteinuria, third trimester: Secondary | ICD-10-CM | POA: Diagnosis present

## 2014-06-10 DIAGNOSIS — O99214 Obesity complicating childbirth: Secondary | ICD-10-CM | POA: Diagnosis present

## 2014-06-10 DIAGNOSIS — O09523 Supervision of elderly multigravida, third trimester: Secondary | ICD-10-CM | POA: Diagnosis present

## 2014-06-10 DIAGNOSIS — Z3493 Encounter for supervision of normal pregnancy, unspecified, third trimester: Secondary | ICD-10-CM | POA: Diagnosis present

## 2014-06-10 DIAGNOSIS — O99824 Streptococcus B carrier state complicating childbirth: Secondary | ICD-10-CM | POA: Diagnosis present

## 2014-06-10 DIAGNOSIS — Z6841 Body Mass Index (BMI) 40.0 and over, adult: Secondary | ICD-10-CM | POA: Diagnosis not present

## 2014-06-10 DIAGNOSIS — Z3A38 38 weeks gestation of pregnancy: Secondary | ICD-10-CM | POA: Diagnosis present

## 2014-06-10 LAB — COMPREHENSIVE METABOLIC PANEL
ALK PHOS: 212 U/L — AB (ref 39–117)
ALT: 32 U/L (ref 0–35)
ANION GAP: 8 (ref 5–15)
AST: 57 U/L — ABNORMAL HIGH (ref 0–37)
Albumin: 2.4 g/dL — ABNORMAL LOW (ref 3.5–5.2)
BILIRUBIN TOTAL: 0.6 mg/dL (ref 0.3–1.2)
BUN: 6 mg/dL (ref 6–23)
CO2: 20 mmol/L (ref 19–32)
Calcium: 8.5 mg/dL (ref 8.4–10.5)
Chloride: 106 mmol/L (ref 96–112)
Creatinine, Ser: 0.89 mg/dL (ref 0.50–1.10)
GFR calc Af Amer: >90 mL/min (ref >90)
GFR, EST NON AFRICAN AMERICAN: 82 mL/min — AB (ref >90)
Glucose, Bld: 145 mg/dL — ABNORMAL HIGH (ref 70–99)
Potassium: 3.5 mmol/L (ref 3.5–5.1)
SODIUM: 134 mmol/L — AB (ref 135–145)
Total Protein: 6.5 g/dL (ref 6.0–8.3)

## 2014-06-10 LAB — CBC
HCT: 34.2 % — ABNORMAL LOW (ref 36.0–46.0)
HCT: 33.1 % — ABNORMAL LOW (ref 36.0–46.0)
Hemoglobin: 11.5 g/dL — ABNORMAL LOW (ref 12.0–15.0)
Hemoglobin: 11 g/dL — ABNORMAL LOW (ref 12.0–15.0)
MCH: 29.8 pg (ref 26.0–34.0)
MCH: 29.3 pg (ref 26.0–34.0)
MCHC: 33.6 g/dL (ref 30.0–36.0)
MCHC: 33.2 g/dL (ref 30.0–36.0)
MCV: 88.6 fL (ref 78.0–100.0)
MCV: 88 fL (ref 78.0–100.0)
PLATELETS: 262 10*3/uL (ref 150–400)
Platelets: 266 10*3/uL (ref 150–400)
RBC: 3.86 MIL/uL — ABNORMAL LOW (ref 3.87–5.11)
RBC: 3.76 MIL/uL — ABNORMAL LOW (ref 3.87–5.11)
RDW: 15.6 % — ABNORMAL HIGH (ref 11.5–15.5)
RDW: 15.8 % — ABNORMAL HIGH (ref 11.5–15.5)
WBC: 9.8 10*3/uL (ref 4.0–10.5)
WBC: 10.8 10*3/uL — ABNORMAL HIGH (ref 4.0–10.5)

## 2014-06-10 LAB — TYPE AND SCREEN
ABO/RH(D): A POS
ANTIBODY SCREEN: NEGATIVE

## 2014-06-10 LAB — PROTEIN / CREATININE RATIO, URINE
Creatinine, Urine: 162 mg/dL
Protein Creatinine Ratio: 0.17 — ABNORMAL HIGH (ref 0.00–0.15)
Total Protein, Urine: 27 mg/dL

## 2014-06-10 LAB — URIC ACID: URIC ACID, SERUM: 6.5 mg/dL (ref 2.4–7.0)

## 2014-06-10 LAB — LACTATE DEHYDROGENASE: LDH: 176 U/L (ref 94–250)

## 2014-06-10 LAB — HIV ANTIBODY (ROUTINE TESTING W REFLEX): HIV Screen 4th Generation wRfx: NONREACTIVE

## 2014-06-10 LAB — RPR: RPR Ser Ql: NONREACTIVE

## 2014-06-10 LAB — ABO/RH: ABO/RH(D): A POS

## 2014-06-10 LAB — HEPATITIS B SURFACE ANTIGEN: HEP B S AG: NEGATIVE

## 2014-06-10 MED ORDER — PENICILLIN G POTASSIUM 5000000 UNITS IJ SOLR
2.5000 10*6.[IU] | INTRAVENOUS | Status: DC
  Administered 2014-06-10 (×2): 2.5 10*6.[IU] via INTRAVENOUS
  Filled 2014-06-10 (×6): qty 2.5

## 2014-06-10 MED ORDER — CITRIC ACID-SODIUM CITRATE 334-500 MG/5ML PO SOLN: 30.0000 mL | ORAL | Status: DC | PRN

## 2014-06-10 MED ORDER — ALBUTEROL SULFATE (2.5 MG/3ML) 0.083% IN NEBU: 3.0000 mL | INHALATION_SOLUTION | RESPIRATORY_TRACT | Status: DC | PRN

## 2014-06-10 MED ORDER — OXYCODONE-ACETAMINOPHEN 5-325 MG PO TABS
1.0000 | ORAL_TABLET | ORAL | Status: DC | PRN
  Administered 2014-06-11 – 2014-06-12 (×6): 1 via ORAL
  Filled 2014-06-10 (×6): qty 1

## 2014-06-10 MED ORDER — TETANUS-DIPHTH-ACELL PERTUSSIS 5-2.5-18.5 LF-MCG/0.5 IM SUSP
0.5000 mL | Freq: Once | INTRAMUSCULAR | Status: DC
  Filled 2014-06-10: qty 0.5

## 2014-06-10 MED ORDER — LACTATED RINGERS IV SOLN
500.0000 mL | INTRAVENOUS | Status: DC | PRN
  Administered 2014-06-10: 250 mL via INTRAVENOUS

## 2014-06-10 MED ORDER — OXYCODONE-ACETAMINOPHEN 5-325 MG PO TABS: 2.0000 | ORAL_TABLET | ORAL | Status: DC | PRN

## 2014-06-10 MED ORDER — DIPHENHYDRAMINE HCL 50 MG/ML IJ SOLN
12.5000 mg | INTRAMUSCULAR | Status: DC | PRN
Start: 2014-06-10 — End: 2014-06-10

## 2014-06-10 MED ORDER — OXYTOCIN 40 UNITS IN LACTATED RINGERS INFUSION - SIMPLE MED
62.5000 mL/h | INTRAVENOUS | Status: DC
  Filled 2014-06-10: qty 1000

## 2014-06-10 MED ORDER — ACETAMINOPHEN 325 MG PO TABS: 650.0000 mg | ORAL_TABLET | ORAL | Status: DC | PRN

## 2014-06-10 MED ORDER — EPHEDRINE 5 MG/ML INJ: 10.0000 mg | INTRAVENOUS | Status: DC | PRN

## 2014-06-10 MED ORDER — DIBUCAINE 1 % RE OINT
1.0000 "application " | TOPICAL_OINTMENT | RECTAL | Status: DC | PRN
  Administered 2014-06-12: 1 via RECTAL
  Filled 2014-06-10 (×2): qty 28

## 2014-06-10 MED ORDER — BUPIVACAINE HCL (PF) 0.25 % IJ SOLN
INTRAMUSCULAR | Status: DC | PRN
  Administered 2014-06-10 (×2): 5 mL

## 2014-06-10 MED ORDER — BUTALBITAL-APAP-CAFFEINE 50-325-40 MG PO TABS: 2.0000 | ORAL_TABLET | ORAL | Status: DC | PRN

## 2014-06-10 MED ORDER — ONDANSETRON HCL 4 MG/2ML IJ SOLN
4.0000 mg | INTRAMUSCULAR | Status: DC | PRN
Start: 2014-06-10 — End: 2014-06-12

## 2014-06-10 MED ORDER — PENICILLIN G POTASSIUM 5000000 UNITS IJ SOLR: 5.0000 10*6.[IU] | Freq: Once | INTRAVENOUS | Status: DC

## 2014-06-10 MED ORDER — OXYTOCIN 40 UNITS IN LACTATED RINGERS INFUSION - SIMPLE MED
1.0000 m[IU]/min | INTRAVENOUS | Status: DC
  Administered 2014-06-10: 2 m[IU]/min via INTRAVENOUS

## 2014-06-10 MED ORDER — LIDOCAINE HCL (PF) 1 % IJ SOLN
INTRAMUSCULAR | Status: DC | PRN
  Administered 2014-06-10 (×2): 4 mL

## 2014-06-10 MED ORDER — DIPHENHYDRAMINE HCL 25 MG PO CAPS: 25.0000 mg | ORAL_CAPSULE | Freq: Four times a day (QID) | ORAL | Status: DC | PRN

## 2014-06-10 MED ORDER — SODIUM CHLORIDE 0.9 % IV SOLN
2.0000 g | Freq: Once | INTRAVENOUS | Status: AC
  Administered 2014-06-10: 2 g via INTRAVENOUS
  Filled 2014-06-10: qty 2000

## 2014-06-10 MED ORDER — LACTATED RINGERS IV SOLN: 500.0000 mL | Freq: Once | INTRAVENOUS | Status: DC

## 2014-06-10 MED ORDER — LIDOCAINE HCL (PF) 1 % IJ SOLN: 30.0000 mL | INTRAMUSCULAR | Status: DC | PRN

## 2014-06-10 MED ORDER — BENZOCAINE-MENTHOL 20-0.5 % EX AERO
1.0000 "application " | INHALATION_SPRAY | CUTANEOUS | Status: DC | PRN
  Administered 2014-06-10: 1 via TOPICAL
  Filled 2014-06-10 (×2): qty 56

## 2014-06-10 MED ORDER — TERBUTALINE SULFATE 1 MG/ML IJ SOLN: 0.2500 mg | Freq: Once | INTRAMUSCULAR | Status: DC | PRN

## 2014-06-10 MED ORDER — ZOLPIDEM TARTRATE 5 MG PO TABS: 5.0000 mg | ORAL_TABLET | Freq: Every evening | ORAL | Status: DC | PRN

## 2014-06-10 MED ORDER — ONDANSETRON HCL 4 MG PO TABS: 4.0000 mg | ORAL_TABLET | ORAL | Status: DC | PRN

## 2014-06-10 MED ORDER — SENNOSIDES-DOCUSATE SODIUM 8.6-50 MG PO TABS
2.0000 | ORAL_TABLET | ORAL | Status: DC
  Administered 2014-06-10 – 2014-06-12 (×2): 2 via ORAL
  Filled 2014-06-10 (×2): qty 2

## 2014-06-10 MED ORDER — OXYCODONE-ACETAMINOPHEN 5-325 MG PO TABS: 1.0000 | ORAL_TABLET | ORAL | Status: DC | PRN

## 2014-06-10 MED ORDER — PENICILLIN G POTASSIUM 5000000 UNITS IJ SOLR
2.5000 10*6.[IU] | INTRAMUSCULAR | Status: DC
  Filled 2014-06-10 (×3): qty 5

## 2014-06-10 MED ORDER — SODIUM CHLORIDE 0.9 % IJ SOLN: 3.0000 mL | INTRAMUSCULAR | Status: DC | PRN

## 2014-06-10 MED ORDER — SODIUM CHLORIDE 0.9 % IV SOLN: 250.0000 mL | INTRAVENOUS | Status: DC | PRN

## 2014-06-10 MED ORDER — SODIUM CHLORIDE 0.9 % IJ SOLN: 3.0000 mL | Freq: Two times a day (BID) | INTRAMUSCULAR | Status: DC

## 2014-06-10 MED ORDER — IBUPROFEN 600 MG PO TABS
600.0000 mg | ORAL_TABLET | Freq: Four times a day (QID) | ORAL | Status: DC
  Administered 2014-06-10 – 2014-06-12 (×8): 600 mg via ORAL
  Filled 2014-06-10 (×8): qty 1

## 2014-06-10 MED ORDER — PHENYLEPHRINE 40 MCG/ML (10ML) SYRINGE FOR IV PUSH (FOR BLOOD PRESSURE SUPPORT)
80.0000 ug | PREFILLED_SYRINGE | INTRAVENOUS | Status: DC | PRN
  Filled 2014-06-10: qty 20

## 2014-06-10 MED ORDER — FENTANYL 2.5 MCG/ML BUPIVACAINE 1/10 % EPIDURAL INFUSION (WH - ANES)
14.0000 mL/h | INTRAMUSCULAR | Status: DC | PRN
  Administered 2014-06-10: 14 mL/h via EPIDURAL
  Filled 2014-06-10 (×2): qty 125

## 2014-06-10 MED ORDER — BUTORPHANOL TARTRATE 1 MG/ML IJ SOLN
1.0000 mg | INTRAMUSCULAR | Status: DC | PRN
  Administered 2014-06-10: 1 mg via INTRAVENOUS
  Filled 2014-06-10: qty 1

## 2014-06-10 MED ORDER — SIMETHICONE 80 MG PO CHEW: 80.0000 mg | CHEWABLE_TABLET | ORAL | Status: DC | PRN

## 2014-06-10 MED ORDER — OXYTOCIN 40 UNITS IN LACTATED RINGERS INFUSION - SIMPLE MED: 1.0000 m[IU]/min | INTRAVENOUS | Status: DC

## 2014-06-10 MED ORDER — FENTANYL 2.5 MCG/ML BUPIVACAINE 1/10 % EPIDURAL INFUSION (WH - ANES)
INTRAMUSCULAR | Status: DC | PRN
  Administered 2014-06-10: 14 mL/h via EPIDURAL

## 2014-06-10 MED ORDER — LACTATED RINGERS IV SOLN
INTRAVENOUS | Status: DC
  Administered 2014-06-10: 06:00:00 via INTRAVENOUS
  Administered 2014-06-10: 1000 mL via INTRAVENOUS

## 2014-06-10 MED ORDER — ONDANSETRON HCL 4 MG/2ML IJ SOLN: 4.0000 mg | Freq: Four times a day (QID) | INTRAMUSCULAR | Status: DC | PRN

## 2014-06-10 MED ORDER — OXYTOCIN BOLUS FROM INFUSION: 500.0000 mL | INTRAVENOUS | Status: DC

## 2014-06-10 MED ORDER — PENICILLIN G POTASSIUM 5000000 UNITS IJ SOLR: 2.5000 10*6.[IU] | INTRAMUSCULAR | Status: DC

## 2014-06-10 MED ORDER — PHENYLEPHRINE 40 MCG/ML (10ML) SYRINGE FOR IV PUSH (FOR BLOOD PRESSURE SUPPORT): 80.0000 ug | PREFILLED_SYRINGE | INTRAVENOUS | Status: DC | PRN

## 2014-06-10 MED ORDER — LANOLIN HYDROUS EX OINT: TOPICAL_OINTMENT | CUTANEOUS | Status: DC | PRN

## 2014-06-10 MED ORDER — PRENATAL MULTIVITAMIN CH
1.0000 | ORAL_TABLET | Freq: Every day | ORAL | Status: DC
  Administered 2014-06-10 – 2014-06-11 (×2): 1 via ORAL
  Filled 2014-06-10 (×3): qty 1

## 2014-06-10 MED ORDER — WITCH HAZEL-GLYCERIN EX PADS
1.0000 "application " | MEDICATED_PAD | CUTANEOUS | Status: DC | PRN
  Administered 2014-06-12: 1 via TOPICAL

## 2014-06-10 NOTE — H&P (Signed)
Pamela Hunt is a 39 y.o. female, G3 P2002 at 38.5 weeks  Patient Active Problem List   Diagnosis Date Noted  . HTN in pregnancy, chronic 06/10/2014  . Elevated BP 06/06/2014  . Third trimester pregnancy 06/06/2014  . Morbid obesity with BMI of 50.0-59.9, adult 02/08/2013  . Hx of acid reflux disease 09/08/2011  . Hx of closely spaced pregnancy 09/08/2011  . Hx of asthma 09/08/2011  . Hx of increased body habitus 09/08/2011    Pregnancy Course: Patient entered care at 7.0 weeks.   EDC of 06/19/14 was established by [place].      [place] evaluations:   10.0 weeks - Dating: crl 3.34, FHR 156, anteverted, dates c/w size  20 weeks - cervical length 4.12,   Vertex, presentation anterior placenta, vervix closed, no apparent abn, female  32.1 weeks - FU: 4lb 11oz - 70.8%, AFI 18.30, FHR 158, BPP 8/8, cervix closed  36.1 weeks - FU Vertex pres. Anterior placenta, Normal AFI. 17.3 cm=65th%tile BPP 8/8 in 7 mins. Cx not measured . Marland Kitchen Significant prenatal events:   Elevated BP   Last evaluation:   38.1 weeks   VE:0/0/-4 on 06/06/14  Reason for admission:  IOL for cHTN  Pt States:   Contractions Frequency: none         Contraction severity: n/a         Fetal activity: +FM  OB History    Gravida Para Term Preterm AB TAB SAB Ectopic Multiple Living   3 2 2       2      Past Medical History  Diagnosis Date  . Headache(784.0)     migraines  . Asthma   . Abnormal Pap smear   . Pneumonia     Had as a child  . GERD (gastroesophageal reflux disease)   . Pregnancy induced hypertension    Past Surgical History  Procedure Laterality Date  . Wisdom tooth extraction     Family History: family history includes Arthritis in her brother; Cancer in her paternal grandmother; Heart disease in her paternal grandfather; Thyroid nodules in her father, maternal grandmother, and mother. Social History:  reports that she has never smoked. She does not have any smokeless tobacco history on file. She reports that  she drinks alcohol. She reports that she does not use illicit drugs.   Prenatal Transfer Tool  Maternal Diabetes: No Genetic Screening: Normal Maternal Ultrasounds/Referrals: Normal Fetal Ultrasounds or other Referrals:  None Maternal Substance Abuse:  No Significant Maternal Medications:  None Significant Maternal Lab Results: Lab values include: Group B Strep positive   ROS:  See HPI above, all other systems are negative  Allergies  Allergen Reactions  . Tetracyclines & Related Anaphylaxis  . Tomato Itching and Swelling    Dilation: 1 Effacement (%): 50 Station: -3 Exam by:: Emmory Solivan, CNM  Blood pressure 135/77, pulse 104, temperature 98.6 F (37 C), temperature source Oral, resp. rate 18, height 5\' 7"  (1.702 m), weight 356 lb (161.481 kg), last menstrual period 09/12/2013. Filed Vitals:   06/09/14 2246 06/09/14 2333 06/10/14 0028 06/10/14 0105  BP: 143/70 154/89 135/77   Pulse: 98 105 104   Temp:   98.6 F (37 C)   TempSrc:   Oral   Resp:   18   Height:    5\' 7"  (1.702 m)  Weight:    356 lb (161.481 kg)   Maternal Exam:  Uterine Assessment: Contraction frequency is rare.  Abdomen: Gravid, non tender. Fundal height is aga.  Normal external genitalia, vulva, cervix, uterus and adnexa.  No lesions noted on exam.  Pelvis adequate for delivery.  Fetal presentation: Vertex by VE  Fetal Exam:  Monitor Surveillance Monitoring Mode: Ultrasound.  NICHD: Category  1 CTXs: q 8 EFW   8 lbs  Physical Exam: Nursing note and vitals reviewed General: alert and cooperative She appears well nourished Psychiatric: Normal mood and affect. Her behavior is normal Head: Normocephalic Eyes: Pupils are equal, round, and reactive to light Neck: Normal range of motion Cardiovascular: RRR without murmur  Respiratory: CTAB. Effort normal  Abd: soft, non-tender, +BS, no rebound, no guarding  Genitourinary: Vagina normal  Neurological: A&Ox3 Skin: Warm and dry  Musculoskeletal:  Normal range of motion  Homan's sign negative bilaterally No evidence of DVTs.  Edema: +3 bilaterally non-pitting edema DTR: 2+ Clonus: None   Prenatal labs: ABO, Rh: A/Positive/-- (06/30 0000) Antibody: Negative (06/30 0000) Rubella:   immune RPR: Nonreactive (12/17 0000)  HBsAg:   negative 4/14 HIV: Non-reactive (12/17 0000)  GBS: Positive (02/04 0000) Sickle cell/Hgb electrophoresis:  WNL Pap:  wnl 08/03/14 GC:   negative Chlamydia: negative Genetic screenings:   Glucola:  nwl  Assessment:  IUP at 38.5 weeks NICHD: Category 1 Membranes: intact Bishop Score: 6 GBS positive Diagnosis: cHTN  Plan:  Admit to L&D for IOL d/t IOL options reviewed with patient including cytotec, foley bulb, AROM, and pitocin R&B of IOL reviewed including serial induction, failure, and/or CS requirement Pt and family verbalize understanding and agree with treatment plan.  Start induction with pitocon GBS prophylaxis with PCN G per Damany Eastman dosing with onset of active labor.   Regular diet prior to starting pitocin Clear/Thin diet after pitocin starts  Continue with labor mgmt as ordered  IV pain medication per orders PRN Epidural per patient request   Anticipate SVD  Attending MD available at all times.   Aima Mcwhirt, CNM, MSN 06/10/2014, 1:28 AM       All information will be confirmed upon admisson

## 2014-06-10 NOTE — MAU Note (Signed)
Report given to [facility], RN patient may transfer to room 166

## 2014-06-10 NOTE — Anesthesia Procedure Notes (Signed)
Epidural Patient location during procedure: OB Start time: 06/10/2014 8:56 AM  Staffing Anesthesiologist: Felipe Drone Performed by: anesthesiologist   Preanesthetic Checklist Completed: patient identified, site marked, surgical consent, pre-op evaluation, timeout performed, IV checked, risks and benefits discussed and monitors and equipment checked  Epidural Patient position: sitting Prep: site prepped and draped and DuraPrep Patient monitoring: continuous pulse ox and blood pressure Approach: midline Location: L3-L4 Injection technique: LOR saline  Needle:  Needle type: Tuohy  Needle gauge: 17 G Needle length: 9 cm and 9 Needle insertion depth: 10 cm Catheter type: closed end flexible Catheter size: 19 Gauge Catheter at skin depth: 15 cm Test dose: negative  Assessment Events: blood not aspirated, injection not painful, no injection resistance, negative IV test and no paresthesia  Additional Notes Patient identified. Risks/Benefits/Options discussed with patient including but not limited to bleeding, infection, nerve damage, paralysis, failed block, incomplete pain control, headache, blood pressure changes, nausea, vomiting, reactions to medication both or allergic, itching and postpartum back pain. Confirmed with bedside nurse the patient's most recent platelet count. Confirmed with patient that they are not currently taking any anticoagulation, have any bleeding history or any family history of bleeding disorders. Patient expressed understanding and wished to proceed. All questions were answered. Sterile technique was used throughout the entire procedure. Please see nursing notes for vital signs. Test dose was given through epidural catheter and negative prior to continuing to dose epidural or start infusion. Warning signs of high block given to the patient including shortness of breath, tingling/numbness in hands, complete motor block, or any concerning symptoms with  instructions to call for help. Patient was given instructions on fall risk and not to get out of bed. All questions and concerns addressed with instructions to call with any issues or inadequate analgesia.

## 2014-06-10 NOTE — Progress Notes (Signed)
This note also relates to the following rows which could not be included: BP - Cannot attach notes to unvalidated device data Pulse Rate - Cannot attach notes to unvalidated device data   Dr Gentry Roch in room admin more med

## 2014-06-10 NOTE — Progress Notes (Signed)
Adleigh Mcmasters MRN: [id number]  Subjective: -Patient reports increased rectal pressure.  Also reporting discomfort with contractions.   Objective: BP 151/103 mmHg  Pulse 98  Temp(Src) 98.1 F (36.7 C) (Oral)  Resp 20  Ht 5\' 7"  (1.702 m)  Wt 356 lb (161.481 kg)  BMI 55.74 kg/m2  SpO2 98%  LMP 09/12/2013     FHT: 125 bpm, Mod Var, + Variable Decels, +Accels UC:  Q1-61min, palpates moderate  SVE:   Dilation: 7 Effacement (%): 80 Station: -1 Exam by:: cnm Silveria Botz Membranes: SROM at 0746 Pitocin: 37mUn/min IUPC inserted without issues Foley catheter in place  Assessment:  IUP at 38.5wks Cat II FT  Labor Augmentation Pain  Plan: -Position change to promote fetal descent and rotation -Anesthesia called for evaluation of epidural and possible redosing -IUPC inserted without issues -Titrate pitocin accordingly based on MVUs -Continue other mgmt as ordered  Akyla Vavrek LYNN,MSN, CNM 06/10/2014, 11:38 AM

## 2014-06-10 NOTE — Progress Notes (Signed)
Labor Progress  Subjective: Feeling more pain with each ctx, requesting IV pain medicine  Objective: BP 154/68 mmHg  Pulse 102  Temp(Src) 98 F (36.7 C) (Oral)  Resp 20  Ht 5\' 7"  (1.702 m)  Wt 356 lb (161.481 kg)  BMI 55.74 kg/m2  LMP 09/12/2013     FHT: 145, moderate variability, + accel, no decel CTX:  regular, every 3-5 minutes Uterus gravid, soft non tender SVE:  Dilation: 1.5 Effacement (%): 50 Station: -3 Exam by:: LCarpenter,RN Pitocin at 36mUn/min  Assessment:  IUP at 38.5 weeks NICHD: Category Membranes:  intact  Labor progress:IOL for HTN Pitocin Augmentation GBS: positive   Plan: Continue labor plan Continuous monitoring Rest/Ambulate Continue pitocin per protocol Dr 4m consulted     Pamela Hunt, CNM, MSN 06/10/2014. 6:32 AM

## 2014-06-10 NOTE — Anesthesia Preprocedure Evaluation (Signed)
Anesthesia Evaluation  Patient identified by MRN, date of birth, ID band Patient awake    Reviewed: Allergy & Precautions, NPO status , Patient's Chart, lab work & pertinent test results  History of Anesthesia Complications Negative for: history of anesthetic complications  Airway Mallampati: III  TM Distance: >3 FB Neck ROM: Full    Dental no notable dental hx. (+) Dental Advisory Given   Pulmonary asthma ,  breath sounds clear to auscultation  Pulmonary exam normal       Cardiovascular hypertension, Pt. on medications Rhythm:Regular Rate:Normal     Neuro/Psych  Headaches, negative psych ROS   GI/Hepatic negative GI ROS, Neg liver ROS, GERD-  ,  Endo/Other  Morbid obesity  Renal/GU negative Renal ROS  negative genitourinary   Musculoskeletal negative musculoskeletal ROS (+)   Abdominal (+) + obese,   Peds negative pediatric ROS (+)  Hematology negative hematology ROS (+)   Anesthesia Other Findings   Reproductive/Obstetrics negative OB ROS                             Anesthesia Physical Anesthesia Plan  ASA: III  Anesthesia Plan: Epidural   Post-op Pain Management:    Induction:   Airway Management Planned:   Additional Equipment:   Intra-op Plan:   Post-operative Plan:   Informed Consent: I have reviewed the patients History and Physical, chart, labs and discussed the procedure including the risks, benefits and alternatives for the proposed anesthesia with the patient or authorized representative who has indicated his/her understanding and acceptance.   Dental advisory given  Plan Discussed with:   Anesthesia Plan Comments:         Anesthesia Quick Evaluation

## 2014-06-10 NOTE — Progress Notes (Signed)
Pamela Hunt MRN: [id number]  Subjective: -0840: Care Assumed.  Nurse call reports SROM and patient with HA, epigastric pain, pitting edema, and elevated AST.  In room to assess.  Patient extremely uncomfortable, requesting epidural.  Will reassess after epidural placement 0930: Patient comfortable after epidural.  Denies headache at current. Reports vaginal pressure.  Objective: BP 152/113 mmHg  Pulse 102  Temp(Src) 98 F (36.7 C) (Oral)  Resp 20  Ht 5\' 7"  (1.702 m)  Wt 356 lb (161.481 kg)  BMI 55.74 kg/m2  LMP 09/12/2013     FHT: 150 bpm, Mod Var, + Early Decels, +Accels UC:   Q1-44min, palpates moderate SVE:   Dilation: 6.5 Effacement (%): 80 Station: -3 Exam by:: [email or web address], CNM Membranes: SROM at 0746 Pitocin: 64mUn/min Foley catheter in place.    Assessment:  IUP at 38.5 wks Cat I FT  CHTN GBS Positive  Plan: -Dr. 4m updated on patient status and advised: Repeat PIH Labs -Fiorcet ordered for HA, prn -Continue other mgmt as ordered  Geryl Dohn LYNN,MSN, CNM 06/10/2014, 8:40 AM

## 2014-06-11 LAB — CBC
HEMATOCRIT: 29.8 % — AB (ref 36.0–46.0)
Hemoglobin: 9.8 g/dL — ABNORMAL LOW (ref 12.0–15.0)
MCH: 29.3 pg (ref 26.0–34.0)
MCHC: 32.9 g/dL (ref 30.0–36.0)
MCV: 89.2 fL (ref 78.0–100.0)
Platelets: 265 10*3/uL (ref 150–400)
RBC: 3.34 MIL/uL — ABNORMAL LOW (ref 3.87–5.11)
RDW: 15.7 % — ABNORMAL HIGH (ref 11.5–15.5)
WBC: 13.2 10*3/uL — ABNORMAL HIGH (ref 4.0–10.5)

## 2014-06-11 NOTE — Lactation Note (Signed)
This note was copied from the chart of Pamela Hunt. Lactation Consultation Note: Initial visit with mom. She reports that she is using NS to get baby to latch. Reports he opens wide but then only takes a few sucks. Reports she used NS with her first baby. IN shells and manual pump given with instructions for use and cleaning. Baby having hearing screen at present and mom eating breakfast. Encouraged mom to call for assist when baby ready to feed again. No questions at present. BF brochure given with resources for support after DC.   Patient Name: Pamela Annetta Deiss [id number] Date: 06/11/2014 Reason for consult: Initial assessment   Maternal Data Formula Feeding for Exclusion: No Does the patient have breastfeeding experience prior to this delivery?: Yes  Feeding   LATCH Score/Interventions    Lactation Tools Discussed/Used Pump Review: Setup, frequency, and cleaning Initiated by:: Dw Date initiated:: 06/11/14   Consult Status Consult Status: Follow-up Date: 06/11/14 Follow-up type: In-patient    [name] 06/11/2014, 9:07 AM

## 2014-06-11 NOTE — Anesthesia Postprocedure Evaluation (Signed)
  Anesthesia Post-op Note  Anesthesia Post Note  Patient: Pamela Hunt  Procedure(s) Performed: * No procedures listed *  Anesthesia type: Epidural  Patient location: Mother/Baby  Post pain: Pain level controlled  Post assessment: Post-op Vital signs reviewed  Last Vitals:  Filed Vitals:   06/11/14 0500  BP: 112/62  Pulse: 72  Temp: 36.4 C  Resp: 20    Post vital signs: Reviewed  Level of consciousness:alert  Complications: No apparent anesthesia complications

## 2014-06-12 DIAGNOSIS — R600 Localized edema: Secondary | ICD-10-CM | POA: Diagnosis present

## 2014-06-12 MED ORDER — HYDROCHLOROTHIAZIDE 25 MG PO TABS: 25.0000 mg | ORAL_TABLET | Freq: Every day | ORAL | Status: DC

## 2014-06-12 MED ORDER — FERROUS SULFATE 325 (65 FE) MG PO TABS: 325.0000 mg | ORAL_TABLET | Freq: Every day | ORAL | Status: DC

## 2014-06-12 MED ORDER — IBUPROFEN 600 MG PO TABS: 600.0000 mg | ORAL_TABLET | Freq: Four times a day (QID) | ORAL | Status: DC | PRN

## 2014-06-12 MED ORDER — OXYCODONE-ACETAMINOPHEN 5-325 MG PO TABS: 1.0000 | ORAL_TABLET | Freq: Four times a day (QID) | ORAL | Status: DC | PRN

## 2014-06-12 NOTE — Discharge Instructions (Signed)
Postpartum Depression and Baby Blues °The postpartum period begins right after the birth of a baby. During this time, there is often a great amount of joy and excitement. It is also a time of many changes in the life of the parents. Regardless of how many times a mother gives birth, each child brings new challenges and dynamics to the family. It is not unusual to have feelings of excitement along with confusing shifts in moods, emotions, and thoughts. All mothers are at risk of developing postpartum depression or the "baby blues." These mood changes can occur right after giving birth, or they may occur many months after giving birth. The baby blues or postpartum depression can be mild or severe. Additionally, postpartum depression can go away rather quickly, or it can be a long-term condition.  °CAUSES °Raised hormone levels and the rapid drop in those levels are thought to be a main cause of postpartum depression and the baby blues. A number of hormones change during and after pregnancy. Estrogen and progesterone usually decrease right after the delivery of your baby. The levels of thyroid hormone and various cortisol steroids also rapidly drop. Other factors that play a role in these mood changes include major life events and genetics.  °RISK FACTORS °If you have any of the following risks for the baby blues or postpartum depression, know what symptoms to watch out for during the postpartum period. Risk factors that may increase the likelihood of getting the baby blues or postpartum depression include: °· Having a personal or family history of depression.   °· Having depression while being pregnant.   °· Having premenstrual mood issues or mood issues related to oral contraceptives. °· Having a lot of life stress.   °· Having marital conflict.   °· Lacking a social support network.   °· Having a baby with special needs.   °· Having health problems, such as diabetes.   °SIGNS AND SYMPTOMS °Symptoms of baby blues  include: °· Brief changes in mood, such as going from extreme happiness to sadness. °· Decreased concentration.   °· Difficulty sleeping.   °· Crying spells, tearfulness.   °· Irritability.   °· Anxiety.   °Symptoms of postpartum depression typically begin within the first month after giving birth. These symptoms include: °· Difficulty sleeping or excessive sleepiness.   °· Marked weight loss.   °· Agitation.   °· Feelings of worthlessness.   °· Lack of interest in activity or food.   °Postpartum psychosis is a very serious condition and can be dangerous. Fortunately, it is rare. Displaying any of the following symptoms is cause for immediate medical attention. Symptoms of postpartum psychosis include:  °· Hallucinations and delusions.   °· Bizarre or disorganized behavior.   °· Confusion or disorientation.   °DIAGNOSIS  °A diagnosis is made by an evaluation of your symptoms. There are no medical or lab tests that lead to a diagnosis, but there are various questionnaires that a health care provider may use to identify those with the baby blues, postpartum depression, or psychosis. Often, a screening tool called the Edinburgh Postnatal Depression Scale is used to diagnose depression in the postpartum period.  °TREATMENT °The baby blues usually goes away on its own in 1-2 weeks. Social support is often all that is needed. You will be encouraged to get adequate sleep and rest. Occasionally, you may be given medicines to help you sleep.  °Postpartum depression requires treatment because it can last several months or longer if it is not treated. Treatment may include individual or group therapy, medicine, or both to address any social, physiological, and psychological   factors that may play a role in the depression. Regular exercise, a healthy diet, rest, and social support may also be strongly recommended.  Postpartum psychosis is more serious and needs treatment right away. Hospitalization is often needed. HOME CARE  INSTRUCTIONS  Get as much rest as you can. Nap when the baby sleeps.   Exercise regularly. Some women find yoga and walking to be beneficial.   Eat a balanced and nourishing diet.   Do little things that you enjoy. Have a cup of tea, take a bubble bath, read your favorite magazine, or listen to your favorite music.  Avoid alcohol.   Ask for help with household chores, cooking, grocery shopping, or running errands as needed. Do not try to do everything.   Talk to people close to you about how you are feeling. Get support from your partner, family members, friends, or other new moms.  Try to stay positive in how you think. Think about the things you are grateful for.   Do not spend a lot of time alone.   Only take over-the-counter or prescription medicine as directed by your health care provider.  Keep all your postpartum appointments.   Let your health care provider know if you have any concerns.  SEEK MEDICAL CARE IF: You are having a reaction to or problems with your medicine. SEEK IMMEDIATE MEDICAL CARE IF:  You have suicidal feelings.   You think you may harm the baby or someone else. MAKE SURE YOU:  Understand these instructions.  Will watch your condition.  Will get help right away if you are not doing well or get worse. Document Released: 01/08/2004 Document Revised: 04/10/2013 Document Reviewed: 01/15/2013 [facility] Patient Information 2015 [place], [place]. This information is not intended to replace advice given to you by your health care provider. Make sure you discuss any questions you have with your health care provider. Etonogestrel implant What is this medicine? ETONOGESTREL (et oh noe JES trel) is a contraceptive (birth control) device. It is used to prevent pregnancy. It can be used for up to 3 years. This medicine may be used for other purposes; ask your health care provider or pharmacist if you have questions. COMMON BRAND NAME(S): Implanon,  Nexplanon What should I tell my health care provider before I take this medicine? They need to know if you have any of these conditions: -abnormal vaginal bleeding -blood vessel disease or blood clots -cancer of the breast, cervix, or liver -depression -diabetes -gallbladder disease -headaches -heart disease or recent heart attack -high blood pressure -high cholesterol -kidney disease -liver disease -renal disease -seizures -tobacco smoker -an unusual or allergic reaction to etonogestrel, other hormones, anesthetics or antiseptics, medicines, foods, dyes, or preservatives -pregnant or trying to get pregnant -breast-feeding How should I use this medicine? This device is inserted just under the skin on the inner side of your upper arm by a health care professional. Talk to your pediatrician regarding the use of this medicine in children. Special care may be needed. Overdosage: If you think you've taken too much of this medicine contact a poison control center or emergency room at once. Overdosage: If you think you have taken too much of this medicine contact a poison control center or emergency room at once. NOTE: This medicine is only for you. Do not share this medicine with others. What if I miss a dose? This does not apply. What may interact with this medicine? Do not take this medicine with any of the following medications: -amprenavir -bosentan -  fosamprenavir This medicine may also interact with the following medications: -barbiturate medicines for inducing sleep or treating seizures -certain medicines for fungal infections like ketoconazole and itraconazole -griseofulvin -medicines to treat seizures like carbamazepine, felbamate, oxcarbazepine, phenytoin, topiramate -modafinil -phenylbutazone -rifampin -some medicines to treat HIV infection like atazanavir, indinavir, lopinavir, nelfinavir, tipranavir, ritonavir -St. John's wort This list may not describe all possible  interactions. Give your health care provider a list of all the medicines, herbs, non-prescription drugs, or dietary supplements you use. Also tell them if you smoke, drink alcohol, or use illegal drugs. Some items may interact with your medicine. What should I watch for while using this medicine? This product does not protect you against HIV infection (AIDS) or other sexually transmitted diseases. You should be able to feel the implant by pressing your fingertips over the skin where it was inserted. Tell your doctor if you cannot feel the implant. What side effects may I notice from receiving this medicine? Side effects that you should report to your doctor or health care professional as soon as possible: -allergic reactions like skin rash, itching or hives, swelling of the face, lips, or tongue -breast lumps -changes in vision -confusion, trouble speaking or understanding -dark urine -depressed mood -general ill feeling or flu-like symptoms -light-colored stools -loss of appetite, nausea -right upper belly pain -severe headaches -severe pain, swelling, or tenderness in the abdomen -shortness of breath, chest pain, swelling in a leg -signs of pregnancy -sudden numbness or weakness of the face, arm or leg -trouble walking, dizziness, loss of balance or coordination -unusual vaginal bleeding, discharge -unusually weak or tired -yellowing of the eyes or skin Side effects that usually do not require medical attention (Report these to your doctor or health care professional if they continue or are bothersome.): -acne -breast pain -changes in weight -cough -fever or chills -headache -irregular menstrual bleeding -itching, burning, and vaginal discharge -pain or difficulty passing urine -sore throat This list may not describe all possible side effects. Call your doctor for medical advice about side effects. You may report side effects to FDA at 1-800-FDA-1088. Where should I keep my  medicine? This drug is given in a hospital or clinic and will not be stored at home. NOTE: This sheet is a summary. It may not cover all possible information. If you have questions about this medicine, talk to your doctor, pharmacist, or health care provider.  2015, [employer]vier/[employer] Standard. (2011-10-11 15:37:45) Iron-Rich Diet An iron-rich diet contains foods that are good sources of iron. Iron is an important mineral that helps your body produce hemoglobin. Hemoglobin is a protein in red blood cells that carries oxygen to the body's tissues. Sometimes, the iron level in your blood can be low. This may be caused by:  A lack of iron in your diet.  Blood loss.  Times of growth, such as during pregnancy or during a child's growth and development. Low levels of iron can cause a decrease in the number of red blood cells. This can result in iron deficiency anemia. Iron deficiency anemia symptoms include:  Tiredness.  Weakness.  Irritability.  Increased chance of infection. Here are some recommendations for daily iron intake:  Males older than 37 years of age need 8 mg of iron per day.  Women ages 61 to 4 need 18 mg of iron per day.  Pregnant women need 27 mg of iron per day, and women who are over 74 years of age and breastfeeding need 9 mg of iron per day.  Women over the age of 50 need 8 mg of iron per day. °SOURCES OF IRON °There are 2 types of iron that are found in food: heme iron and nonheme iron. Heme iron is absorbed by the body better than nonheme iron. Heme iron is found in meat, poultry, and fish. Nonheme iron is found in grains, beans, and vegetables. °Heme Iron Sources °Food / Iron (mg) °· Chicken liver, 3 oz (85 g)/ 10 mg °· Beef liver, 3 oz (85 g)/ 5.5 mg °· Oysters, 3 oz (85 g)/ 8 mg °· Beef, 3 oz (85 g)/ 2 to 3 mg °· Shrimp, 3 oz (85 g)/ 2.8 mg °· Turkey, 3 oz (85 g)/ 2 mg °· Chicken, 3 oz (85 g) / 1 mg °· Fish (tuna, halibut), 3 oz (85 g)/ 1 mg °· Pork, 3 oz (85 g)/ 0.9  mg °Nonheme Iron Sources °Food / Iron (mg) °· Ready-to-eat breakfast cereal, iron-fortified / 3.9 to 7 mg °· Tofu, ½ cup / 3.4 mg °· Kidney beans, ½ cup / 2.6 mg °· Baked potato with skin / 2.7 mg °· Asparagus, ½ cup / 2.2 mg °· Avocado / 2 mg °· Dried peaches, ½ cup / 1.6 mg °· Raisins, ½ cup / 1.5 mg °· Soy milk, 1 cup / 1.5 mg °· Whole-wheat bread, 1 slice / 1.2 mg °· Spinach, 1 cup / 0.8 mg °· Broccoli, ½ cup / 0.6 mg °IRON ABSORPTION °Certain foods can decrease the body's absorption of iron. Try to avoid these foods and beverages while eating meals with iron-containing foods: °· Coffee. °· Tea. °· Fiber. °· Soy. °Foods containing vitamin C can help increase the amount of iron your body absorbs from iron sources, especially from nonheme sources. Eat foods with vitamin C along with iron-containing foods to increase your iron absorption. Foods that are high in vitamin C include many fruits and vegetables. Some good sources are: °· Fresh orange juice. °· Oranges. °· Strawberries. °· Mangoes. °· Grapefruit. °· Red bell peppers. °· Green bell peppers. °· Broccoli. °· Potatoes with skin. °· Tomato juice. °Document Released: 11/17/2004 Document Revised: 06/28/2011 Document Reviewed: 09/24/2010 °[employer]® Patient Information ©2015 [employer]. This information is not intended to replace advice given to you by your health care provider. Make sure you discuss any questions you have with your health care provider. ° °

## 2014-06-12 NOTE — Lactation Note (Signed)
This note was copied from the chart of Pamela Ailyne Pawley. Lactation Consultation Note  Patient Name: Pamela Hunt OEVOJ'J Date: 06/12/2014 Reason for consult: Follow-up assessment  Bay is 82 hours old and has been discharged. Per mom breast feeding is going well . Mom denies soreness. Sore nipple and engorgement prevention and tx reviewed. Per mom has been instructed on use of hand pump and given a large flange   for when the milk comes in. Mother informed of post-discharge support and given phone number to the lactation department, including services for phone call assistance; out-patient appointments; and breastfeeding support group. List of other breastfeeding resources in the community given in the handout. Encouraged mother to call for problems or concerns related to breastfeeding.   Maternal Data Has patient been taught Hand Expression?:  (reviewed )  Feeding Feeding Type:  (per mom last fed at 0720 for 50 mins ) Length of feed: 30 min (reported by mom)  LATCH Score/Interventions                Intervention(s): Breastfeeding basics reviewed     Lactation Tools Discussed/Used Tools: Pump Breast pump type: Manual (per mom has already been instructed )   Consult Status Consult Status: Complete Date: 06/12/14    Kathrin Greathouse 06/12/2014, 9:59 AM

## 2014-06-12 NOTE — Discharge Summary (Signed)
Vaginal Delivery Discharge Summary  Pamela Hunt  DOB:    [birth year] MRN:    [id number] CSN:    [id number]  Date of admission:                  Jun 09, 2014   Date of discharge:                   Jun 12, 2014  Procedures this admission:   SVD  Date of Delivery: Jun 10, 2014  Newborn Data:  Live born female  Birth Weight: 8 lb 5.7 oz (3790 g) APGAR: 9,   Home with mother. Name: Pamela Hunt Circumcision Plan: Completed Inpt  History of Present Illness:  Ms. Pamela Hunt is a 39 y.o. female, G3P3003, who presents at [redacted]w[redacted]d weeks gestation. The patient has been followed at the [facility] division of [facility]. She was admitted induction of labor. Her pregnancy has been complicated by:  Patient Active Problem List   Diagnosis Date Noted  . SVD (spontaneous vaginal delivery) 06/12/2014  . Edema of both legs 06/12/2014  . HTN in pregnancy, chronic 06/10/2014  . Elevated BP 06/06/2014  . Third trimester pregnancy 06/06/2014  . Morbid obesity with BMI of 50.0-59.9, adult 02/08/2013  . Hx of acid reflux disease 09/08/2011  . Hx of closely spaced pregnancy 09/08/2011  . Hx of asthma 09/08/2011  . Hx of increased body habitus 09/08/2011     Hospital Course:  Admitted for unscheduled IOL for elevated bp. Positive GBS. Progressed with pitocin. Utilized epidural for pain management.  Delivery was performed by Pamela Hunt, CNM without complication. Patient and baby tolerated the procedure without difficulty, with  no laceration noted. Infant status was stable and remained in room with mother.  Mother and infant then had an uncomplicated postpartum course, with breast feeding going well. Mom's physical exam was WNL, and she was discharged home in stable condition. Contraception plan was nexplanon.  She received adequate benefit from po pain medications.   Feeding:  breast  Contraception:  Nexplanon  Discharge hemoglobin:  HEMOGLOBIN   Date Value Ref Range Status  06/11/2014 9.8* 12.0 - 15.0 g/dL Final   HCT  Date Value Ref Range Status  06/11/2014 29.8* 36.0 - 46.0 % Final    Discharge Physical Exam:   General: alert, cooperative and no distress  Chest: Lungs CTA, Heart RRR Abdomen: Soft, NT, BS x 4 Lochia: appropriate Uterine Fundus: firm at U/-1 Incision: None DVT Evaluation: No evidence of DVT seen on physical exam. Calf/Ankle edema is present, +4 pitting Skin: Warm, Dry  Intrapartum Procedures: spontaneous vaginal delivery and GBS prophylaxis Postpartum Procedures: none Complications-Operative and Postpartum: none  Discharge Diagnoses: Term Pregnancy-delivered and Edema  Discharge Information:  Activity:           pelvic rest Diet:                routine Medications: PNV, Ibuprofen, Iron, Percocet and HCTZ Condition:      stable Instructions:  Pain Management, Peri-Care, Breastfeeding, HCTZ for Edema--R/B/Precautions, Who and When to call for postpartum complications. Information Sheet(s) given PPD/BB, Nexplanon Information, Iron Rich Diet  Discharge to: home  Follow-up Information    Follow up with [facility]. Schedule an appointment as soon as possible for a visit in 5 weeks.   Specialty:  Obstetrics and Gynecology   Why:  Please call if you have any questions or concerns prior to your next visit.  Contact information:   [address] [place] [address] [phone]       Marlene Bast, MSN, CNM 06/12/2014 9:55 AM

## 2014-06-13 ENCOUNTER — Encounter (HOSPITAL_COMMUNITY): Payer: Self-pay | Admitting: *Deleted

## 2014-09-05 DIAGNOSIS — I10 Essential (primary) hypertension: Secondary | ICD-10-CM | POA: Insufficient documentation

## 2014-11-13 ENCOUNTER — Encounter (HOSPITAL_BASED_OUTPATIENT_CLINIC_OR_DEPARTMENT_OTHER): Payer: Self-pay | Admitting: *Deleted

## 2014-11-13 ENCOUNTER — Emergency Department (HOSPITAL_BASED_OUTPATIENT_CLINIC_OR_DEPARTMENT_OTHER)
Admission: EM | Admit: 2014-11-13 | Discharge: 2014-11-13 | Disposition: A | Discharge status: Home / Self Care | Payer: BC Managed Care – PPO | Attending: Emergency Medicine | Admitting: Emergency Medicine

## 2014-11-13 DIAGNOSIS — J45909 Unspecified asthma, uncomplicated: Secondary | ICD-10-CM | POA: Diagnosis not present

## 2014-11-13 DIAGNOSIS — G43111 Migraine with aura, intractable, with status migrainosus: Secondary | ICD-10-CM | POA: Diagnosis not present

## 2014-11-13 DIAGNOSIS — Z8701 Personal history of pneumonia (recurrent): Secondary | ICD-10-CM | POA: Insufficient documentation

## 2014-11-13 DIAGNOSIS — Z79899 Other long term (current) drug therapy: Secondary | ICD-10-CM | POA: Insufficient documentation

## 2014-11-13 DIAGNOSIS — Z8719 Personal history of other diseases of the digestive system: Secondary | ICD-10-CM | POA: Diagnosis not present

## 2014-11-13 DIAGNOSIS — R51 Headache: Secondary | ICD-10-CM | POA: Diagnosis present

## 2014-11-13 MED ORDER — SODIUM CHLORIDE 0.9 % IV BOLUS (SEPSIS)
1000.0000 mL | Freq: Once | INTRAVENOUS | Status: AC
  Administered 2014-11-13: 1000 mL via INTRAVENOUS

## 2014-11-13 MED ORDER — DIPHENHYDRAMINE HCL 50 MG/ML IJ SOLN
25.0000 mg | Freq: Once | INTRAMUSCULAR | Status: AC
  Administered 2014-11-13: 25 mg via INTRAVENOUS
  Filled 2014-11-13: qty 1

## 2014-11-13 MED ORDER — GI COCKTAIL ~~LOC~~
30.0000 mL | Freq: Once | ORAL | Status: AC
  Administered 2014-11-13: 30 mL via ORAL
  Filled 2014-11-13: qty 30

## 2014-11-13 MED ORDER — METOCLOPRAMIDE HCL 5 MG/ML IJ SOLN
10.0000 mg | Freq: Once | INTRAMUSCULAR | Status: AC
  Administered 2014-11-13: 10 mg via INTRAVENOUS
  Filled 2014-11-13: qty 2

## 2014-11-13 NOTE — ED Notes (Signed)
Pt assisted to call her mother who will pick her up and drive her home. Pt verbalizes understanding that medications will make her drowsy and she cannot drive herself home.

## 2014-11-13 NOTE — ED Notes (Signed)
Pt amb to room 10 with quick steady gait in nad. Pt reports "migraine" x Monday, with light sensitivity, intermittent dizzyness today.

## 2014-11-13 NOTE — ED Notes (Signed)
Pt father is at the bedside to drive her home.

## 2014-11-13 NOTE — Discharge Instructions (Signed)

## 2014-11-13 NOTE — ED Provider Notes (Addendum)
CSN: [id number]     Arrival date & time 11/13/14  0957 History   First MD Initiated Contact with Patient 11/13/14 [phone]     Chief Complaint  Patient presents with  . Headache     (Consider location/radiation/quality/duration/timing/severity/associated sxs/prior Treatment) Patient is a 39 y.o. female presenting with headaches. The history is provided by the patient.  Headache Pain location:  Frontal Quality:  Dull Radiates to:  Face Severity currently:  9/10 Severity at highest:  9/10 Onset quality:  Gradual Duration:  3 days Timing:  Constant Progression:  Worsening Chronicity:  Recurrent Similar to prior headaches: yes   Context: activity, bright light and loud noise   Relieved by:  Nothing Worsened by:  Light and sound Ineffective treatments:  NSAIDs Associated symptoms: nausea, photophobia and vomiting   Associated symptoms: no abdominal pain, no blurred vision, no congestion, no cough, no diarrhea, no fever, no focal weakness, no neck pain, no neck stiffness and no visual change   Associated symptoms comment:  Ringing in the ears. Today she noticed some dizziness which she describes as a moving sensation when she is up and walking around. Also last night after dry heaving she developed some chest discomfort in the center of her chest that she's states is a tightness. It is slightly better this morning but she is also not eaten. She does have a history of acid reflux but just take any medication for it currently Risk factors comment:  History of migraines   Past Medical History  Diagnosis Date  . Headache(784.0)     migraines  . Asthma   . Abnormal Pap smear   . Pneumonia     Had as a child  . GERD (gastroesophageal reflux disease)   . Pregnancy induced hypertension    Past Surgical History  Procedure Laterality Date  . Wisdom tooth extraction     Family History  Problem Relation Age of Onset  . Thyroid nodules Mother   . Thyroid nodules Father   . Arthritis  Brother   . Thyroid nodules Maternal Grandmother   . Cancer Paternal Grandmother   . Heart disease Paternal Grandfather    History  Substance Use Topics  . Smoking status: Never Smoker   . Smokeless tobacco: Not on file  . Alcohol Use: Yes     Comment: socially    OB History    Gravida Para Term Preterm AB TAB SAB Ectopic Multiple Living   3 3 3       0 3     Review of Systems  Constitutional: Negative for fever.  HENT: Negative for congestion.   Eyes: Positive for photophobia. Negative for blurred vision.  Respiratory: Negative for cough.   Gastrointestinal: Positive for nausea and vomiting. Negative for abdominal pain and diarrhea.  Musculoskeletal: Negative for neck pain and neck stiffness.  Neurological: Positive for headaches. Negative for focal weakness.  All other systems reviewed and are negative.     Allergies  Tetracyclines & related and Tomato  Home Medications   Prior to Admission medications   Medication Sig Start Date End Date Taking? Authorizing Provider  acetaminophen (TYLENOL) 500 MG tablet Take 1,000 mg by mouth every 6 (six) hours as needed for mild pain.    Historical Provider, MD  albuterol (PROVENTIL HFA;VENTOLIN HFA) 108 (90 BASE) MCG/ACT inhaler Inhale 2 puffs into the lungs every 4 (four) hours as needed for wheezing (cough, shortness of breath or wheezing.). 05/25/12   Godfrey Pick, PA-C  calcium carbonate (TUMS -  DOSED IN MG ELEMENTAL CALCIUM) 500 MG chewable tablet Chew 2 tablets by mouth 4 (four) times daily as needed for indigestion or heartburn.    Historical Provider, MD  ferrous sulfate (FERROUSUL) 325 (65 FE) MG tablet Take 1 tablet (325 mg total) by mouth daily with breakfast. For 5-6weeks. 06/12/14   Gerrit Heck, CNM  hydrochlorothiazide (HYDRODIURIL) 25 MG tablet Take 1 tablet (25 mg total) by mouth daily. for 7 days. 06/12/14   Gerrit Heck, CNM  ibuprofen (ADVIL,MOTRIN) 600 MG tablet Take 1 tablet (600 mg total) by mouth every 6 (six)  hours as needed. 06/12/14   Gerrit Heck, CNM  oxyCODONE-acetaminophen (PERCOCET/ROXICET) 5-325 MG per tablet Take 1-2 tablets by mouth every 6 (six) hours as needed (for pain scale equal to or greater than 7). 06/12/14   Gerrit Heck, CNM  Prenatal Vit-Fe Fumarate-FA (PRENATAL MULTIVITAMIN) TABS tablet Take 1 tablet by mouth daily at 12 noon.    Historical Provider, MD   BP 129/60 mmHg  Pulse 71  Temp(Src) 98.7 F (37.1 C) (Oral)  Resp 18  Ht 5\' 7"  (1.702 m)  Wt 305 lb (138.347 kg)  BMI 47.76 kg/m2  SpO2 98% Physical Exam  Constitutional: She is oriented to person, place, and time. She appears well-developed and well-nourished. No distress.  HENT:  Head: Normocephalic and atraumatic.  Mouth/Throat: Oropharynx is clear and moist.  Eyes: Conjunctivae and EOM are normal. Pupils are equal, round, and reactive to light. Right eye exhibits no discharge. Left eye exhibits no discharge.  photophobia  Neck: Normal range of motion. Neck supple. No spinous process tenderness present. No rigidity. No Brudzinski's sign and no Kernig's sign noted.  Cardiovascular: Normal rate, normal heart sounds and intact distal pulses.   No murmur heard. Pulmonary/Chest: Effort normal and breath sounds normal. No respiratory distress. She has no wheezes. She has no rales.  Abdominal: Soft. She exhibits no distension. There is no tenderness.  Musculoskeletal: Normal range of motion. She exhibits no edema or tenderness.  Lymphadenopathy:    She has no cervical adenopathy.  Neurological: She is alert and oriented to person, place, and time. She has normal strength. No cranial nerve deficit or sensory deficit. Coordination and gait normal. GCS eye subscore is 4. GCS verbal subscore is 5. GCS motor subscore is 6.  Skin: Skin is warm and dry.  Psychiatric: She has a normal mood and affect. Her behavior is normal.  Nursing note and vitals reviewed.   ED Course  Procedures (including critical care time) Labs  Review Labs Reviewed - No data to display  Imaging Review No results found.   EKG Interpretation   Date/Time:  Wednesday November 13 2014 10:52:30 EDT Ventricular Rate:  64 PR Interval:  156 QRS Duration: 92 QT Interval:  414 QTC Calculation: 427 R Axis:   27 Text Interpretation:  Normal sinus rhythm with sinus arrhythmia Normal ECG  No significant change since last tracing Confirmed by 11-03-2002  MD,  Anitra Lauth (Alphonzo Lemmings) on 11/13/2014 11:16:05 AM      MDM   Final diagnoses:  Intractable migraine with aura with status migrainosus    Pt with typical migraine HA without sx suggestive of SAH(sudden onset, worst of life, or deficits), infection, or cavernous vein thrombosis.  Normal neuro exam and vital signs.  Patient also states today she started to have a sensation of feeling dizzy as well as some midline chest discomfort that started last night after dry heaving and has continued. Patient does suffer from acid reflux but  is not currently taking any medication for it. Feel patient's symptoms are most likely related to acid reflux and not eating. Will give GI cocktail for this.  EKG pending Will give HA cocktail and on re-eval.   Patient is currently breast-feeding and she was instructed that she will need to pump and dome after receiving the headache cocktail however she was to continue with the medications.  11:32 AM Pt is feeling much better and pain is resolving.  Will d/c home.  Gwyneth Sprout, MD 11/13/14 1133  Gwyneth Sprout, MD 11/13/14 1134

## 2016-09-13 ENCOUNTER — Encounter (HOSPITAL_BASED_OUTPATIENT_CLINIC_OR_DEPARTMENT_OTHER): Payer: Self-pay | Admitting: *Deleted

## 2016-09-13 ENCOUNTER — Emergency Department (HOSPITAL_BASED_OUTPATIENT_CLINIC_OR_DEPARTMENT_OTHER)
Admission: EM | Admit: 2016-09-13 | Discharge: 2016-09-13 | Disposition: A | Discharge status: Home / Self Care | Payer: BC Managed Care – PPO | Attending: Emergency Medicine | Admitting: Emergency Medicine

## 2016-09-13 DIAGNOSIS — J45909 Unspecified asthma, uncomplicated: Secondary | ICD-10-CM | POA: Diagnosis not present

## 2016-09-13 DIAGNOSIS — R1084 Generalized abdominal pain: Secondary | ICD-10-CM | POA: Diagnosis present

## 2016-09-13 DIAGNOSIS — R1032 Left lower quadrant pain: Secondary | ICD-10-CM | POA: Diagnosis not present

## 2016-09-13 DIAGNOSIS — R109 Unspecified abdominal pain: Secondary | ICD-10-CM

## 2016-09-13 DIAGNOSIS — Z79899 Other long term (current) drug therapy: Secondary | ICD-10-CM | POA: Diagnosis not present

## 2016-09-13 LAB — URINALYSIS, MICROSCOPIC (REFLEX): RBC / HPF: NONE SEEN RBC/hpf (ref 0–5)

## 2016-09-13 LAB — CBC WITH DIFFERENTIAL/PLATELET
BASOS ABS: 0 10*3/uL (ref 0.0–0.1)
BASOS PCT: 0 %
EOS PCT: 3 %
Eosinophils Absolute: 0.2 10*3/uL (ref 0.0–0.7)
HCT: 35.2 % — ABNORMAL LOW (ref 36.0–46.0)
Hemoglobin: 11.8 g/dL — ABNORMAL LOW (ref 12.0–15.0)
Lymphocytes Relative: 49 %
Lymphs Abs: 2.5 10*3/uL (ref 0.7–4.0)
MCH: 31.2 pg (ref 26.0–34.0)
MCHC: 33.5 g/dL (ref 30.0–36.0)
MCV: 93.1 fL (ref 78.0–100.0)
Monocytes Absolute: 0.4 10*3/uL (ref 0.1–1.0)
Monocytes Relative: 8 %
NEUTROS ABS: 2 10*3/uL (ref 1.7–7.7)
Neutrophils Relative %: 40 %
Platelets: 181 10*3/uL (ref 150–400)
RBC: 3.78 MIL/uL — ABNORMAL LOW (ref 3.87–5.11)
RDW: 13.4 % (ref 11.5–15.5)
WBC: 5.1 10*3/uL (ref 4.0–10.5)

## 2016-09-13 LAB — COMPREHENSIVE METABOLIC PANEL
ALT: 17 U/L (ref 14–54)
AST: 19 U/L (ref 15–41)
Albumin: 3.2 g/dL — ABNORMAL LOW (ref 3.5–5.0)
Alkaline Phosphatase: 58 U/L (ref 38–126)
Anion gap: 5 (ref 5–15)
BUN: 8 mg/dL (ref 6–20)
CO2: 24 mmol/L (ref 22–32)
CREATININE: 0.81 mg/dL (ref 0.44–1.00)
Calcium: 8.1 mg/dL — ABNORMAL LOW (ref 8.9–10.3)
Chloride: 108 mmol/L (ref 101–111)
Glucose, Bld: 94 mg/dL (ref 65–99)
POTASSIUM: 3.4 mmol/L — AB (ref 3.5–5.1)
Sodium: 137 mmol/L (ref 135–145)
TOTAL PROTEIN: 5.7 g/dL — AB (ref 6.5–8.1)
Total Bilirubin: 0.3 mg/dL (ref 0.3–1.2)

## 2016-09-13 LAB — URINALYSIS, ROUTINE W REFLEX MICROSCOPIC
Glucose, UA: NEGATIVE mg/dL
Hgb urine dipstick: NEGATIVE
KETONES UR: NEGATIVE mg/dL
NITRITE: NEGATIVE
Protein, ur: NEGATIVE mg/dL
Specific Gravity, Urine: 1.017 (ref 1.005–1.030)
pH: 6 (ref 5.0–8.0)

## 2016-09-13 LAB — PREGNANCY, URINE: PREG TEST UR: NEGATIVE

## 2016-09-13 LAB — LIPASE, BLOOD: LIPASE: 16 U/L (ref 11–51)

## 2016-09-13 MED ORDER — METHOCARBAMOL 500 MG PO TABS: 500.0000 mg | ORAL_TABLET | Freq: Three times a day (TID) | ORAL | 0 refills | Status: DC | PRN

## 2016-09-13 NOTE — ED Provider Notes (Signed)
[facility] Provider Note   CSN: [id number] Arrival date & time: 09/13/16  0706     History   Chief Complaint Chief Complaint  Patient presents with  . Flank Pain    HPI Pamela Hunt is a 41 y.o. female.  HPI Patient presents with left flank pain. Began yesterday. Has been worsened today. States it does feel little worse after she urinates. States it feels a deep pain. Had some nausea vomiting yesterday.. Pain is dull. She's had previous gastric bypass. No diarrhea. No dysuria. No fevers. No cough. No trauma.   Past Medical History:  Diagnosis Date  . Abnormal Pap smear   . Asthma   . GERD (gastroesophageal reflux disease)   . Headache(784.0)    migraines  . Pneumonia    Had as a child  . Pregnancy induced hypertension     Patient Active Problem List   Diagnosis Date Noted  . SVD (spontaneous vaginal delivery) 06/12/2014  . Edema of both legs 06/12/2014  . HTN in pregnancy, chronic 06/10/2014  . Elevated BP 06/06/2014  . Third trimester pregnancy 06/06/2014  . Morbid obesity with BMI of 50.0-59.9, adult (HCC) 02/08/2013  . Hx of acid reflux disease 09/08/2011  . Hx of closely spaced pregnancy 09/08/2011  . Hx of asthma 09/08/2011  . Hx of increased body habitus 09/08/2011    Past Surgical History:  Procedure Laterality Date  . WISDOM TOOTH EXTRACTION      OB History    Gravida Para Term Preterm AB Living   3 3 3     3    SAB TAB Ectopic Multiple Live Births         0 3       Home Medications    Prior to Admission medications   Medication Sig Start Date End Date Taking? Authorizing Provider  Adalimumab (HUMIRA [place]) Inject into the skin.   Yes [provider]  acetaminophen (TYLENOL) 500 MG tablet Take 1,000 mg by mouth every 6 (six) hours as needed for mild pain.    [provider]  albuterol (PROVENTIL HFA;VENTOLIN HFA) 108 (90 BASE) MCG/ACT inhaler Inhale 2 puffs into the lungs every 4 (four) hours as needed for  wheezing (cough, shortness of breath or wheezing.). 05/25/12   07/23/12, PA-C  calcium carbonate (TUMS - DOSED IN MG ELEMENTAL CALCIUM) 500 MG chewable tablet Chew 2 tablets by mouth 4 (four) times daily as needed for indigestion or heartburn.    [provider]  methocarbamol (ROBAXIN) 500 MG tablet Take 1 tablet (500 mg total) by mouth every 8 (eight) hours as needed for muscle spasms. 09/13/16   09/15/16, MD    Family History Family History  Problem Relation Age of Onset  . Thyroid nodules Mother   . Thyroid nodules Father   . Arthritis Brother   . Thyroid nodules Maternal Grandmother   . Cancer Paternal Grandmother   . Heart disease Paternal Grandfather     Social History Social History  Substance Use Topics  . Smoking status: Never Smoker  . Smokeless tobacco: Not on file  . Alcohol use Yes     Comment: socially      Allergies   Tetracyclines & related and Tomato   Review of Systems Review of Systems  Constitutional: Negative for appetite change and fever.  HENT: Negative for congestion.   Respiratory: Negative for shortness of breath.   Cardiovascular: Negative for chest pain.  Gastrointestinal: Positive for nausea. Negative for abdominal  distention and abdominal pain.  Genitourinary: Positive for flank pain. Negative for decreased urine volume, menstrual problem and pelvic pain.  Musculoskeletal: Negative for back pain.  Skin: Negative for pallor and wound.  Neurological: Positive for numbness.       Patient states she had some slight numbness in her left hand. Is on the little finger and the finger next to it. She is left-handed has had no difficulty using this hand  Hematological: Negative for adenopathy.  Psychiatric/Behavioral: Negative for confusion.     Physical Exam Updated Vital Signs BP 121/82 (BP Location: Right Arm)   Pulse 64   Temp 98.5 F (36.9 C) (Oral)   Resp 18   Ht 5\' 7"  (1.702 m)   Wt 99.8 kg (220 lb)   LMP  09/06/2016   SpO2 100%   BMI 34.46 kg/m   Physical Exam  Constitutional: She appears well-developed.  HENT:  Head: Atraumatic.  Neck: Neck supple.  Cardiovascular: Normal rate.   Pulmonary/Chest: Effort normal. She has no wheezes. She has no rales.  Abdominal: There is tenderness.  Left upper quadrant tenderness without rebound or guarding.  Genitourinary:  Genitourinary Comments: CVA tenderness on left side. No rash in this area.  Neurological: She is alert.  Skin: Skin is warm. Capillary refill takes less than 2 seconds.  Psychiatric: She has a normal mood and affect.     ED Treatments / Results  Labs (all labs ordered are listed, but only abnormal results are displayed) Labs Reviewed  COMPREHENSIVE METABOLIC PANEL - Abnormal; Notable for the following:       Result Value   Potassium 3.4 (*)    Calcium 8.1 (*)    Total Protein 5.7 (*)    Albumin 3.2 (*)    All other components within normal limits  CBC WITH DIFFERENTIAL/PLATELET - Abnormal; Notable for the following:    RBC 3.78 (*)    Hemoglobin 11.8 (*)    HCT 35.2 (*)    All other components within normal limits  URINALYSIS, ROUTINE W REFLEX MICROSCOPIC - Abnormal; Notable for the following:    Bilirubin Urine SMALL (*)    Leukocytes, UA TRACE (*)    All other components within normal limits  URINALYSIS, MICROSCOPIC (REFLEX) - Abnormal; Notable for the following:    Bacteria, UA MANY (*)    Squamous Epithelial / LPF 6-30 (*)    All other components within normal limits  LIPASE, BLOOD  PREGNANCY, URINE    EKG  EKG Interpretation None       Radiology No results found.  Procedures Procedures (including critical care time)  Medications Ordered in ED Medications - No data to display   Initial Impression / Assessment and Plan / ED Course  I have reviewed the triage vital signs and the nursing notes.  Pertinent labs & imaging results that were available during my care of the patient were reviewed by  me and considered in my medical decision making (see chart for details).     Patient with left flank pain. Began for last few days. Lab work reassuring. Urine does not show infection. Does however have some bacteria. Normal systemic white count. No hematuria. Lipase is not elevated. Will treat as muscle skeletal pain but discussed with patient about need for follow-up. Will discharge home.  Final Clinical Impressions(s) / ED Diagnoses   Final diagnoses:  Left flank pain    New Prescriptions New Prescriptions   METHOCARBAMOL (ROBAXIN) 500 MG TABLET    Take 1  tablet (500 mg total) by mouth every 8 (eight) hours as needed for muscle spasms.     Benjiman Core, MD 09/13/16 [phone]

## 2016-09-13 NOTE — ED Triage Notes (Signed)
Pt reports left upper flank pain wrapping around to her back.  Worsening pain with movement of the left arm and tender on palpation.  Denies known injury.

## 2017-06-30 DIAGNOSIS — Z719 Counseling, unspecified: Secondary | ICD-10-CM | POA: Diagnosis not present

## 2017-07-11 DIAGNOSIS — Z719 Counseling, unspecified: Secondary | ICD-10-CM | POA: Diagnosis not present

## 2017-07-18 DIAGNOSIS — Z719 Counseling, unspecified: Secondary | ICD-10-CM | POA: Diagnosis not present

## 2017-07-25 DIAGNOSIS — Z719 Counseling, unspecified: Secondary | ICD-10-CM | POA: Diagnosis not present

## 2017-08-01 DIAGNOSIS — Z719 Counseling, unspecified: Secondary | ICD-10-CM | POA: Diagnosis not present

## 2017-08-04 DIAGNOSIS — Z9884 Bariatric surgery status: Secondary | ICD-10-CM | POA: Diagnosis not present

## 2017-08-08 DIAGNOSIS — Z719 Counseling, unspecified: Secondary | ICD-10-CM | POA: Diagnosis not present

## 2017-08-17 DIAGNOSIS — Z719 Counseling, unspecified: Secondary | ICD-10-CM | POA: Diagnosis not present

## 2017-08-22 DIAGNOSIS — Z719 Counseling, unspecified: Secondary | ICD-10-CM | POA: Diagnosis not present

## 2017-09-05 DIAGNOSIS — Z719 Counseling, unspecified: Secondary | ICD-10-CM | POA: Diagnosis not present

## 2017-09-19 DIAGNOSIS — Z719 Counseling, unspecified: Secondary | ICD-10-CM | POA: Diagnosis not present

## 2017-10-03 DIAGNOSIS — Z719 Counseling, unspecified: Secondary | ICD-10-CM | POA: Diagnosis not present

## 2017-10-21 ENCOUNTER — Other Ambulatory Visit: Payer: Self-pay

## 2017-10-21 ENCOUNTER — Encounter (HOSPITAL_BASED_OUTPATIENT_CLINIC_OR_DEPARTMENT_OTHER): Payer: Self-pay

## 2017-10-21 ENCOUNTER — Emergency Department (HOSPITAL_BASED_OUTPATIENT_CLINIC_OR_DEPARTMENT_OTHER): Payer: 59

## 2017-10-21 ENCOUNTER — Emergency Department (HOSPITAL_BASED_OUTPATIENT_CLINIC_OR_DEPARTMENT_OTHER)
Admission: EM | Admit: 2017-10-21 | Discharge: 2017-10-21 | Disposition: A | Discharge status: Home / Self Care | Payer: 59 | Attending: Emergency Medicine | Admitting: Emergency Medicine

## 2017-10-21 DIAGNOSIS — Z79899 Other long term (current) drug therapy: Secondary | ICD-10-CM | POA: Diagnosis not present

## 2017-10-21 DIAGNOSIS — R079 Chest pain, unspecified: Secondary | ICD-10-CM | POA: Diagnosis present

## 2017-10-21 DIAGNOSIS — J45909 Unspecified asthma, uncomplicated: Secondary | ICD-10-CM | POA: Insufficient documentation

## 2017-10-21 DIAGNOSIS — R0789 Other chest pain: Secondary | ICD-10-CM | POA: Diagnosis not present

## 2017-10-21 LAB — CBC WITH DIFFERENTIAL/PLATELET
Basophils Absolute: 0 10*3/uL (ref 0.0–0.1)
Basophils Relative: 0 %
EOS ABS: 0.1 10*3/uL (ref 0.0–0.7)
EOS PCT: 2 %
HCT: 34.9 % — ABNORMAL LOW (ref 36.0–46.0)
HEMOGLOBIN: 11.5 g/dL — AB (ref 12.0–15.0)
Lymphocytes Relative: 38 %
Lymphs Abs: 2.2 10*3/uL (ref 0.7–4.0)
MCH: 30.6 pg (ref 26.0–34.0)
MCHC: 33 g/dL (ref 30.0–36.0)
MCV: 92.8 fL (ref 78.0–100.0)
Monocytes Absolute: 0.6 10*3/uL (ref 0.1–1.0)
Monocytes Relative: 10 %
NEUTROS PCT: 50 %
Neutro Abs: 2.9 10*3/uL (ref 1.7–7.7)
PLATELETS: 212 10*3/uL (ref 150–400)
RBC: 3.76 MIL/uL — AB (ref 3.87–5.11)
RDW: 13.4 % (ref 11.5–15.5)
WBC: 5.8 10*3/uL (ref 4.0–10.5)

## 2017-10-21 LAB — COMPREHENSIVE METABOLIC PANEL
ALK PHOS: 68 U/L (ref 38–126)
ALT: 18 U/L (ref 0–44)
ANION GAP: 7 (ref 5–15)
AST: 19 U/L (ref 15–41)
Albumin: 3.4 g/dL — ABNORMAL LOW (ref 3.5–5.0)
BILIRUBIN TOTAL: 0.3 mg/dL (ref 0.3–1.2)
BUN: 6 mg/dL (ref 6–20)
CALCIUM: 8.1 mg/dL — AB (ref 8.9–10.3)
CO2: 24 mmol/L (ref 22–32)
CREATININE: 0.91 mg/dL (ref 0.44–1.00)
Chloride: 109 mmol/L (ref 98–111)
GFR calc non Af Amer: >60 mL/min (ref >60)
Glucose, Bld: 72 mg/dL (ref 70–99)
Potassium: 3.6 mmol/L (ref 3.5–5.1)
Sodium: 140 mmol/L (ref 135–145)
TOTAL PROTEIN: 6.4 g/dL — AB (ref 6.5–8.1)

## 2017-10-21 LAB — URINALYSIS, ROUTINE W REFLEX MICROSCOPIC
Bilirubin Urine: NEGATIVE
Glucose, UA: NEGATIVE mg/dL
HGB URINE DIPSTICK: NEGATIVE
KETONES UR: NEGATIVE mg/dL
Leukocytes, UA: NEGATIVE
Nitrite: NEGATIVE
PROTEIN: NEGATIVE mg/dL
Specific Gravity, Urine: 1.02 (ref 1.005–1.030)
pH: 6 (ref 5.0–8.0)

## 2017-10-21 LAB — TROPONIN I: Troponin I: <0.03 ng/mL (ref <0.03)

## 2017-10-21 LAB — LIPASE, BLOOD: Lipase: 27 U/L (ref 11–51)

## 2017-10-21 LAB — PREGNANCY, URINE: PREG TEST UR: NEGATIVE

## 2017-10-21 LAB — D-DIMER, QUANTITATIVE (NOT AT ARMC): D DIMER QUANT: 0.49 ug{FEU}/mL (ref 0.00–0.50)

## 2017-10-21 MED ORDER — NAPROXEN 500 MG PO TABS: 500.0000 mg | ORAL_TABLET | Freq: Two times a day (BID) | ORAL | 0 refills | Status: DC

## 2017-10-21 MED ORDER — FAMOTIDINE 20 MG PO TABS: 20.0000 mg | ORAL_TABLET | Freq: Two times a day (BID) | ORAL | 0 refills | Status: DC

## 2017-10-21 NOTE — ED Notes (Signed)
Patient is alert and orientedx4.  Patient was explained discharge instructions and they understood them with no questions.   

## 2017-10-21 NOTE — Discharge Instructions (Signed)
1.  Take Pepcid twice daily.  Take naproxen with a meal twice daily. 2.  Schedule recheck with your family doctor next week. 3.  Return to the emergency department if you develop new symptoms or symptoms are worsening.

## 2017-10-21 NOTE — ED Provider Notes (Signed)
[facility] EMERGENCY DEPARTMENT Provider Note   CSN: [id number] Arrival date & time: 10/21/17  1711     History   Chief Complaint Chief Complaint  Patient presents with  . Chest Pain    HPI Pamela Hunt is a 42 y.o. female.  HPI Patient reports she has had left-sided chest pain for 2 days.  Was gradual in onset.  Aching in quality.  Reports she is noticing it in the left anterior along the sternum and slightly towards her side.  She reports it has persisted since yesterday and gotten somewhat worse.  Initially, she thought maybe she was coming down with a cold.  She has not had fever or productive cough.  She reports that may make her feel slightly short of breath.  She reports it is not interfered with any of her activities. no Lower extremity swelling or calf pain.  Patient is a non-smoker.  Positive birth control use.  No history of PE or DVT. Past Medical History:  Diagnosis Date  . Abnormal Pap smear   . Asthma   . GERD (gastroesophageal reflux disease)   . Headache(784.0)    migraines  . Pneumonia    Had as a child  . Pregnancy induced hypertension     Patient Active Problem List   Diagnosis Date Noted  . SVD (spontaneous vaginal delivery) 06/12/2014  . Edema of both legs 06/12/2014  . HTN in pregnancy, chronic 06/10/2014  . Elevated BP 06/06/2014  . Third trimester pregnancy 06/06/2014  . Morbid obesity with BMI of 50.0-59.9, adult (HCC) 02/08/2013  . Hx of acid reflux disease 09/08/2011  . Hx of closely spaced pregnancy 09/08/2011  . Hx of asthma 09/08/2011  . Hx of increased body habitus 09/08/2011    Past Surgical History:  Procedure Laterality Date  . GASTRIC BYPASS    . WISDOM TOOTH EXTRACTION       OB History    Gravida  3   Para  3   Term  3   Preterm      AB      Living  3     SAB      TAB      Ectopic      Multiple  0   Live Births  3            Home Medications    Prior to Admission medications     Medication Sig Start Date End Date Taking? Authorizing Provider  ALPRAZolam Prudy Feeler) 0.25 MG tablet Take 1 tablet by mouth daily. 07/07/17  Yes [provider]  Adalimumab (HUMIRA PEN) 40 MG/0.8ML PNKT Inject 1 Dose into the skin every 21 ( twenty-one) days.    [provider]  buPROPion (WELLBUTRIN XL) 300 MG 24 hr tablet Take 1 tablet by mouth daily. 10/15/17   [provider]  famotidine (PEPCID) 20 MG tablet Take 1 tablet (20 mg total) by mouth 2 (two) times daily. 10/21/17   Arby Barrette, MD  naproxen (NAPROSYN) 500 MG tablet Take 1 tablet (500 mg total) by mouth 2 (two) times daily. 10/21/17   Arby Barrette, MD  Burr Medico 150-35 MCG/24HR transdermal patch Apply 1 patch topically once a week. 10/13/17   [provider]    Family History Family History  Problem Relation Age of Onset  . Thyroid nodules Mother   . Thyroid nodules Father   . Thyroid nodules Maternal Grandmother   . Cancer Paternal Grandmother   . Heart disease Paternal Grandfather   .  Arthritis Brother     Social History Social History   Tobacco Use  . Smoking status: Never Smoker  . Smokeless tobacco: Never Used  Substance Use Topics  . Alcohol use: Yes    Comment: occ  . Drug use: No     Allergies   Tetracyclines & related and Tomato   Review of Systems Review of Systems 10 Systems reviewed and are negative for acute change except as noted in the HPI.   Physical Exam Updated Vital Signs BP 124/80   Pulse 77   Temp 98.2 F (36.8 C) (Oral)   Resp (!) 23   Ht 5\' 7"  (1.702 m)   Wt 104.3 kg (230 lb)   LMP 09/23/2017   SpO2 100%   BMI 36.02 kg/m   Physical Exam  Constitutional: She is oriented to person, place, and time. She appears well-developed and well-nourished. No distress.  HENT:  Head: Normocephalic and atraumatic.  Mouth/Throat: Oropharynx is clear and moist.  Eyes: EOM are normal.  Neck: Neck supple.  Cardiovascular: Normal rate, regular rhythm,  normal heart sounds and intact distal pulses.  Pulmonary/Chest: Effort normal and breath sounds normal. She exhibits tenderness.  Tender palpation left sternal border ribs approximately 5- 7,no lesions or soft tissue abnormalities of chest wall.  No crepitus.  Breast nontender.  Abdominal: Soft. Bowel sounds are normal. She exhibits no distension. There is no tenderness. There is no guarding.  Musculoskeletal: Normal range of motion. She exhibits no edema or tenderness.  Catheter soft.  No popliteal fossa tenderness or calf tenderness.  Neurological: She is alert and oriented to person, place, and time. She has normal strength. She exhibits normal muscle tone. Coordination normal. GCS eye subscore is 4. GCS verbal subscore is 5. GCS motor subscore is 6.  Skin: Skin is warm, dry and intact.  Psychiatric: She has a normal mood and affect.     ED Treatments / Results  Labs (all labs ordered are listed, but only abnormal results are displayed) Labs Reviewed  COMPREHENSIVE METABOLIC PANEL - Abnormal; Notable for the following components:      Result Value   Calcium 8.1 (*)    Total Protein 6.4 (*)    Albumin 3.4 (*)    All other components within normal limits  CBC WITH DIFFERENTIAL/PLATELET - Abnormal; Notable for the following components:   RBC 3.76 (*)    Hemoglobin 11.5 (*)    HCT 34.9 (*)    All other components within normal limits  LIPASE, BLOOD  D-DIMER, QUANTITATIVE (NOT AT [facility])  URINALYSIS, ROUTINE W REFLEX MICROSCOPIC  PREGNANCY, URINE  TROPONIN I    EKG EKG Interpretation  Date/Time:  Friday October 21 2017 17:23:01 EDT Ventricular Rate:  87 PR Interval:    QRS Duration: 102 QT Interval:  379 QTC Calculation: 456 R Axis:   50 Text Interpretation:  Sinus rhythm normal no change from previous Confirmed by Arby Barrette [phone]) on 10/21/2017 5:36:35 PM   Radiology Dg Chest 2 View  Result Date: 10/21/2017 CLINICAL DATA:  Left chest pain for 24 hours. EXAM: CHEST - 2  VIEW COMPARISON:  PA and lateral chest 05/04/2013.  CT chest 01/26/2007. FINDINGS: The lungs are clear. Heart size is normal. No pneumothorax or pleural effusion. No focal bony abnormality. IMPRESSION: Negative chest. Electronically Signed   By: Drusilla Kanner M.D.   On: 10/21/2017 18:40    Procedures Procedures (including critical care time)  Medications Ordered in ED Medications - No data to display  Initial Impression / Assessment and Plan / ED Course  I have reviewed the triage vital signs and the nursing notes.  Pertinent labs & imaging results that were available during my care of the patient were reviewed by me and considered in my medical decision making (see chart for details).      Final Clinical Impressions(s) / ED Diagnoses   Final diagnoses:  Nonspecific chest pain   Patient is alert and appropriate.  She is clinically well in appearance.  He has developed chest pain over the past 2 days.  This has been constant.  Troponin is negative.  EKG normal and unchanged.  Low risk for cardiac ischemic disease.  Patient does take birth control.  He is a non-smoker.no  Hypoxia, no tachypnea, no tachycardia.  D-dimer without elevation.  Low probability for pulmonary embolus.  Plan to treat for chest wall pain.  Also counseled patient on Pepcid twice daily and taking naproxen with meals to avoid any complications of potential reflux or esophagitis.  Told to return if any new symptoms or worsening symptoms develop.  Patient will follow-up with PCP next week. ED Discharge Orders        Ordered    famotidine (PEPCID) 20 MG tablet  2 times daily     10/21/17 1934    naproxen (NAPROSYN) 500 MG tablet  2 times daily     10/21/17 1934       Arby Barrette, MD 10/21/17 1946

## 2017-10-21 NOTE — ED Triage Notes (Signed)
C/o CP day 2-NAD-steady gait

## 2017-10-25 DIAGNOSIS — M25432 Effusion, left wrist: Secondary | ICD-10-CM | POA: Diagnosis not present

## 2017-10-25 DIAGNOSIS — M0609 Rheumatoid arthritis without rheumatoid factor, multiple sites: Secondary | ICD-10-CM | POA: Diagnosis not present

## 2017-10-25 DIAGNOSIS — M130 Polyarthritis, unspecified: Secondary | ICD-10-CM | POA: Diagnosis not present

## 2018-01-25 DIAGNOSIS — M0609 Rheumatoid arthritis without rheumatoid factor, multiple sites: Secondary | ICD-10-CM | POA: Diagnosis not present

## 2018-01-25 DIAGNOSIS — R5383 Other fatigue: Secondary | ICD-10-CM | POA: Diagnosis not present

## 2018-01-25 DIAGNOSIS — M25432 Effusion, left wrist: Secondary | ICD-10-CM | POA: Diagnosis not present

## 2018-02-09 DIAGNOSIS — Z6835 Body mass index (BMI) 35.0-35.9, adult: Secondary | ICD-10-CM | POA: Diagnosis not present

## 2018-02-09 DIAGNOSIS — Z01419 Encounter for gynecological examination (general) (routine) without abnormal findings: Secondary | ICD-10-CM | POA: Diagnosis not present

## 2018-03-07 DIAGNOSIS — Z1231 Encounter for screening mammogram for malignant neoplasm of breast: Secondary | ICD-10-CM | POA: Diagnosis not present

## 2018-04-18 DIAGNOSIS — N898 Other specified noninflammatory disorders of vagina: Secondary | ICD-10-CM | POA: Diagnosis not present

## 2018-05-01 DIAGNOSIS — E785 Hyperlipidemia, unspecified: Secondary | ICD-10-CM | POA: Diagnosis not present

## 2018-05-01 DIAGNOSIS — Z0000 Encounter for general adult medical examination without abnormal findings: Secondary | ICD-10-CM | POA: Diagnosis not present

## 2018-06-05 DIAGNOSIS — S93402A Sprain of unspecified ligament of left ankle, initial encounter: Secondary | ICD-10-CM | POA: Diagnosis not present

## 2018-06-05 DIAGNOSIS — M79672 Pain in left foot: Secondary | ICD-10-CM | POA: Diagnosis not present

## 2018-06-13 DIAGNOSIS — M25572 Pain in left ankle and joints of left foot: Secondary | ICD-10-CM | POA: Diagnosis not present

## 2018-08-11 DIAGNOSIS — S46812A Strain of other muscles, fascia and tendons at shoulder and upper arm level, left arm, initial encounter: Secondary | ICD-10-CM | POA: Diagnosis not present

## 2018-09-18 DIAGNOSIS — M069 Rheumatoid arthritis, unspecified: Secondary | ICD-10-CM | POA: Insufficient documentation

## 2018-09-18 DIAGNOSIS — G43829 Menstrual migraine, not intractable, without status migrainosus: Secondary | ICD-10-CM | POA: Insufficient documentation

## 2018-11-18 IMAGING — CR DG CHEST 2V
2 series · 2 of 2 positions shown · non-contrast
Comparison: PA and lateral chest 05/04/2013.  CT chest 01/26/2007.

CLINICAL DATA: Left chest pain for 24 hours.

EXAM:
CHEST - 2 VIEW

[w chest pa]
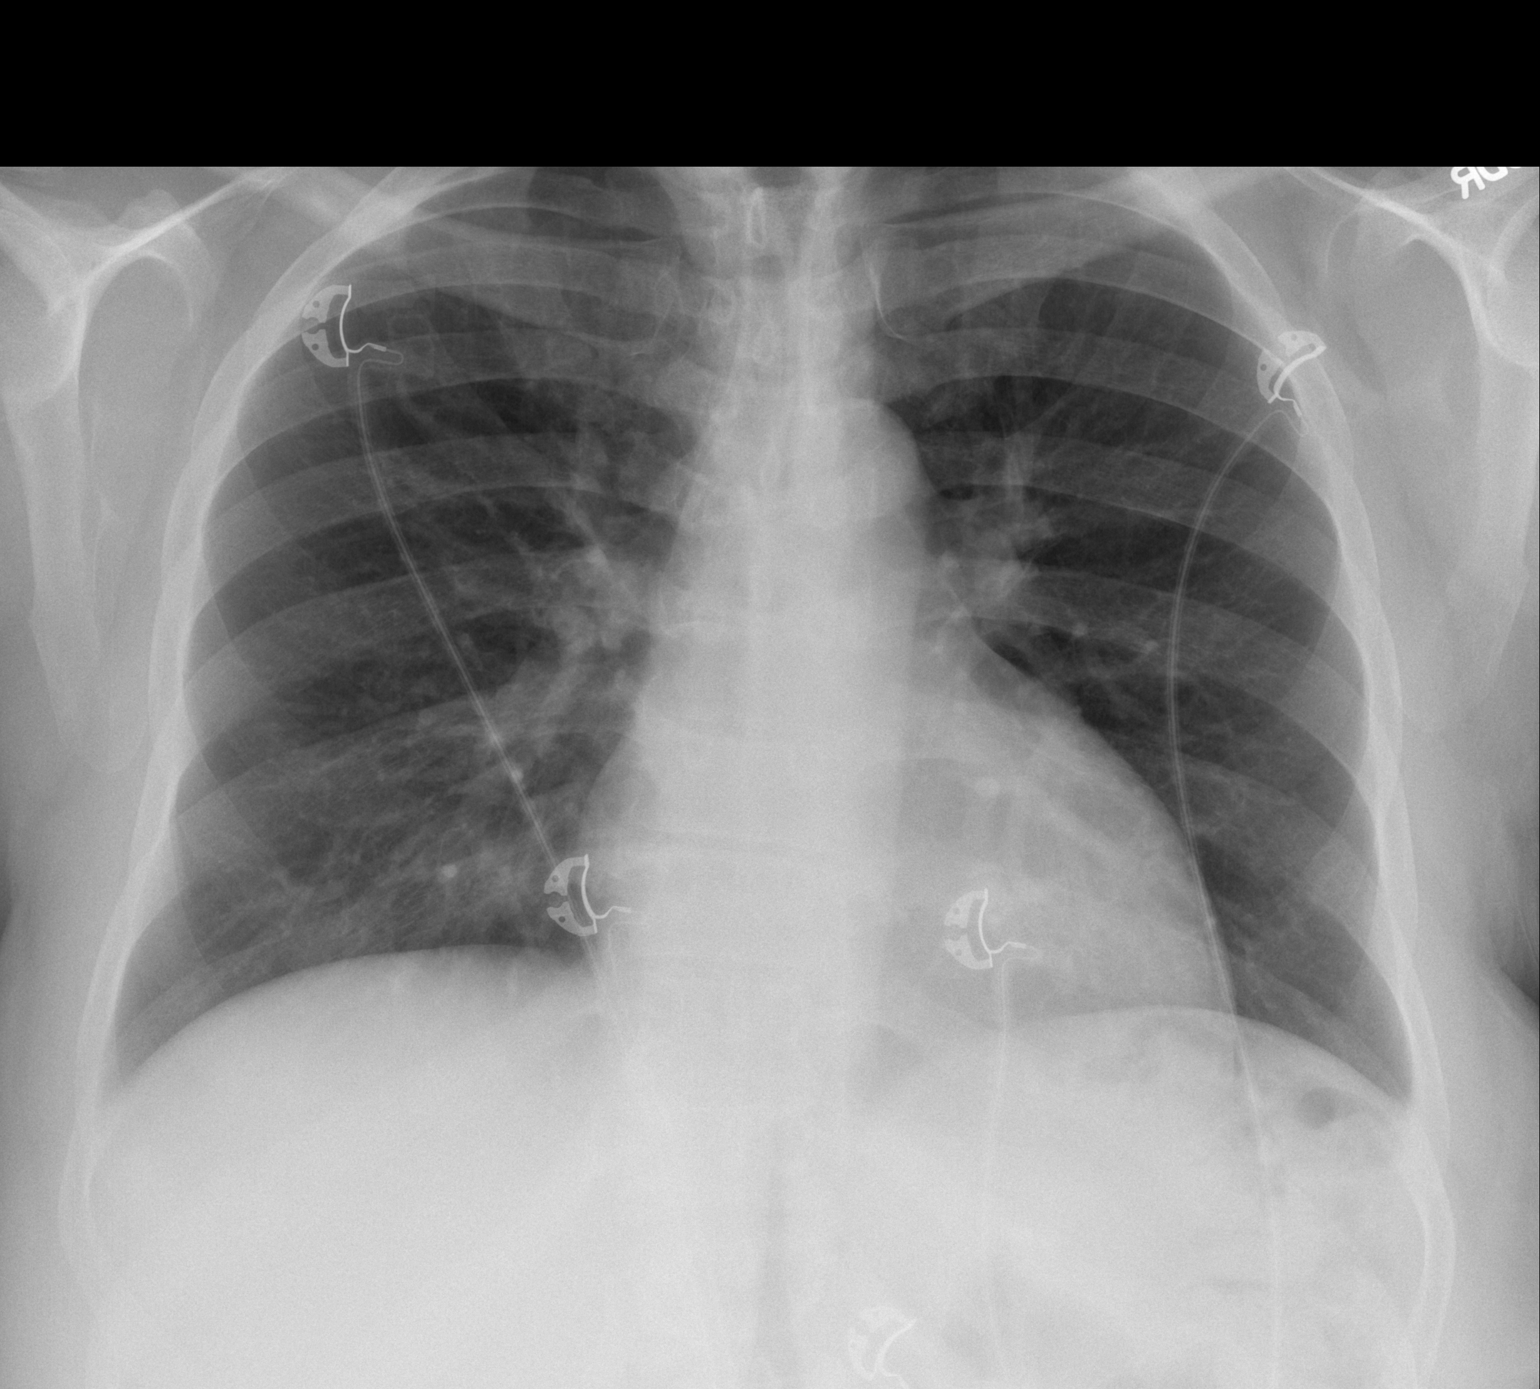

[w chest lat]
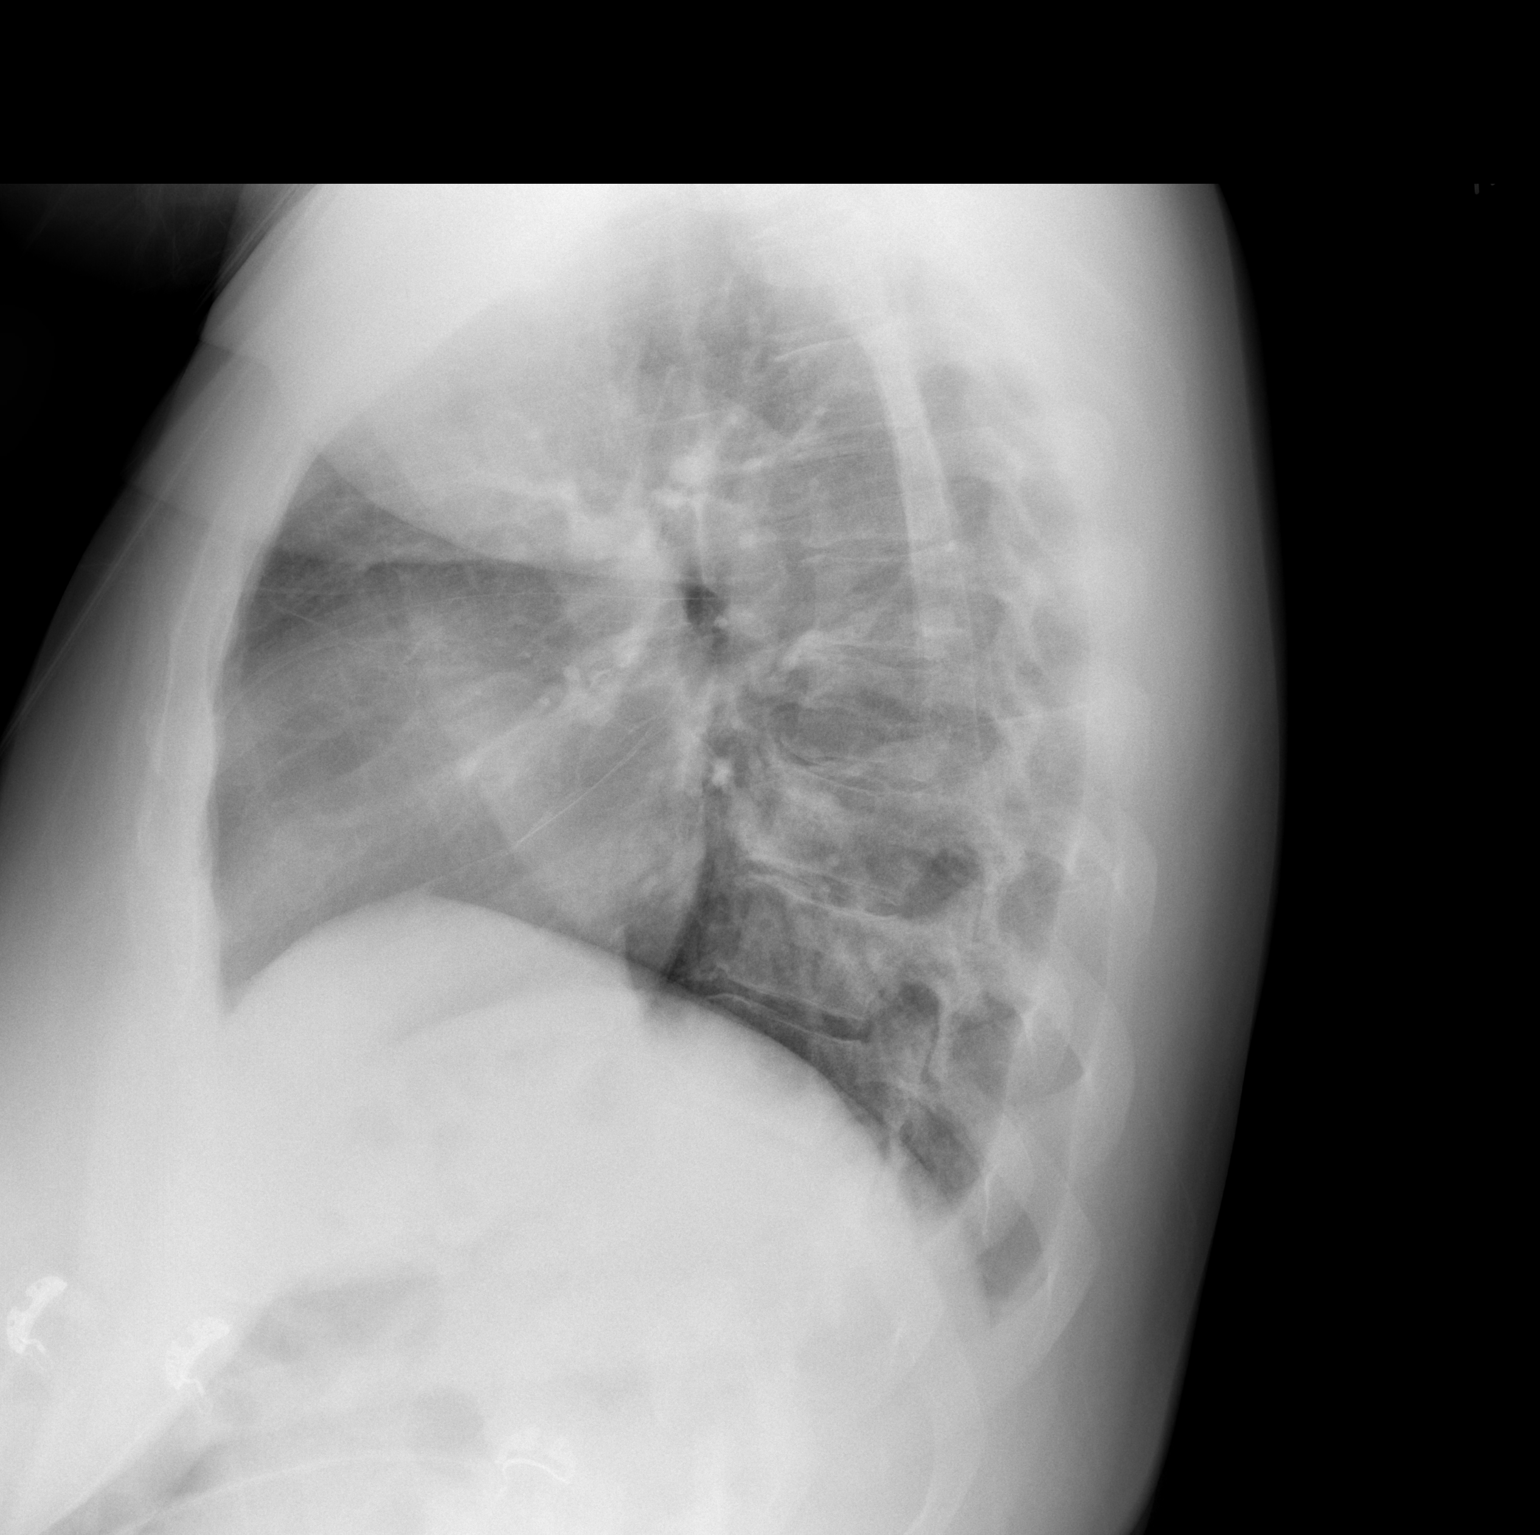

[2 of 2 positions shown; findings below may reference images not displayed]

FINDINGS: The lungs are clear. Heart size is normal. No pneumothorax or
pleural effusion. No focal bony abnormality.
IMPRESSION: Negative chest.

## 2019-04-11 ENCOUNTER — Other Ambulatory Visit: Payer: Self-pay | Admitting: Obstetrics and Gynecology

## 2019-04-11 DIAGNOSIS — E049 Nontoxic goiter, unspecified: Secondary | ICD-10-CM

## 2019-04-17 ENCOUNTER — Ambulatory Visit
Admission: RE | Admit: 2019-04-17 | Discharge: 2019-04-17 | Disposition: A | Discharge status: Home / Self Care | Payer: 59 | Source: Ambulatory Visit | Attending: Obstetrics and Gynecology | Admitting: Obstetrics and Gynecology

## 2019-04-17 DIAGNOSIS — E049 Nontoxic goiter, unspecified: Secondary | ICD-10-CM

## 2019-06-28 ENCOUNTER — Ambulatory Visit: Payer: 59 | Attending: Internal Medicine

## 2019-06-28 DIAGNOSIS — Z23 Encounter for immunization: Secondary | ICD-10-CM

## 2019-06-28 NOTE — Progress Notes (Signed)
   Covid-19 Vaccination Clinic  Name:  [name]    MRN: [id number] DOB: April 29, 1975  06/28/2019  [name] was observed post Covid-19 immunization for 15 minutes without incident. She was provided with Vaccine Information Sheet and instruction to access the V-Safe system.   [name] was instructed to call 911 with any severe reactions post vaccine: Marland Kitchen Difficulty breathing  . Swelling of face and throat  . A fast heartbeat  . A bad rash all over body  . Dizziness and weakness   Immunizations Administered    Name Date Dose VIS Date Route   Pfizer COVID-19 Vaccine 06/28/2019  3:14 PM 0.3 mL 03/30/2019 Intramuscular   Manufacturer: [employer], [employer]   Lot: [id number]   NDC: [address]-1

## 2019-07-23 ENCOUNTER — Ambulatory Visit: Payer: 59 | Attending: Internal Medicine

## 2019-07-23 DIAGNOSIS — Z23 Encounter for immunization: Secondary | ICD-10-CM

## 2019-07-23 NOTE — Progress Notes (Signed)
   Covid-19 Vaccination Clinic  Name:  Jule Whitsel    MRN: [id number] DOB: 04-Oct-1975  07/23/2019  Ms. Brassfield was observed post Covid-19 immunization for 15 minutes without incident. She was provided with Vaccine Information Sheet and instruction to access the V-Safe system.   Ms. Wollen was instructed to call 911 with any severe reactions post vaccine: Marland Kitchen Difficulty breathing  . Swelling of face and throat  . A fast heartbeat  . A bad rash all over body  . Dizziness and weakness   Immunizations Administered    Name Date Dose VIS Date Route   Pfizer COVID-19 Vaccine 07/23/2019  1:04 PM 0.3 mL 03/30/2019 Intramuscular   Manufacturer: [employer], [employer]   Lot: [id number]   NDC: [address]-9

## 2019-10-30 ENCOUNTER — Other Ambulatory Visit: Payer: Self-pay | Admitting: Physician Assistant

## 2019-10-30 DIAGNOSIS — E042 Nontoxic multinodular goiter: Secondary | ICD-10-CM | POA: Insufficient documentation

## 2019-11-15 ENCOUNTER — Other Ambulatory Visit (HOSPITAL_COMMUNITY)
Admission: RE | Admit: 2019-11-15 | Discharge: 2019-11-15 | Disposition: A | Discharge status: Home / Self Care | Payer: 59 | Source: Ambulatory Visit | Attending: Physician Assistant | Admitting: Physician Assistant

## 2019-11-15 ENCOUNTER — Ambulatory Visit
Admission: RE | Admit: 2019-11-15 | Discharge: 2019-11-15 | Disposition: A | Discharge status: Home / Self Care | Payer: 59 | Source: Ambulatory Visit | Attending: Physician Assistant | Admitting: Physician Assistant

## 2019-11-15 DIAGNOSIS — E041 Nontoxic single thyroid nodule: Secondary | ICD-10-CM | POA: Diagnosis not present

## 2019-11-15 DIAGNOSIS — E042 Nontoxic multinodular goiter: Secondary | ICD-10-CM

## 2019-11-16 LAB — CYTOLOGY - NON PAP

## 2020-02-02 ENCOUNTER — Other Ambulatory Visit: Payer: 59

## 2020-05-14 IMAGING — US US THYROID
2 series · 13 of 25 positions shown · non-contrast
Comparison: None.

CLINICAL DATA: Goiter.

EXAM:
THYROID ULTRASOUND
TECHNIQUE: Ultrasound examination of the thyroid gland and adjacent soft
tissues was performed.

[Series 1: us thyroid · 0.06mm/px · 55 acquisitions, 8 frames shown (1 of 2)]
[im 1/55]
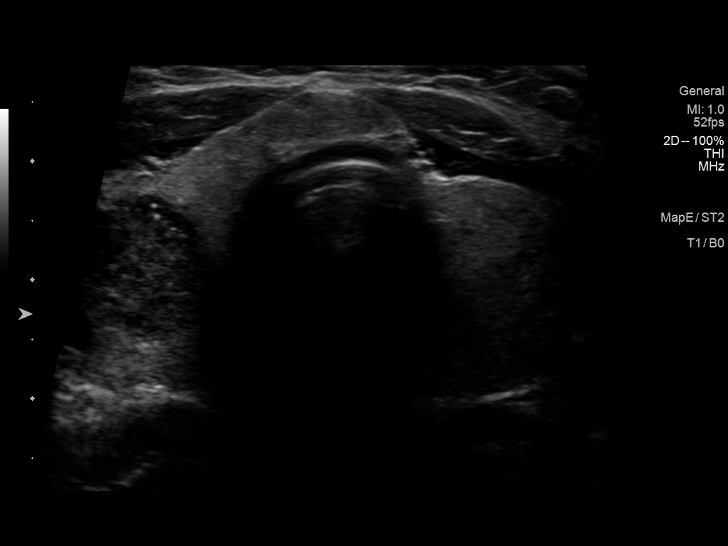
[im 8/55]
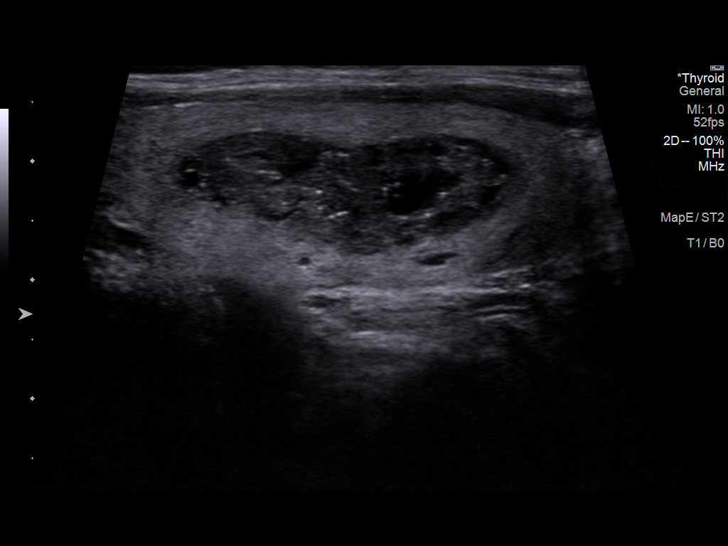
[im 16/55]
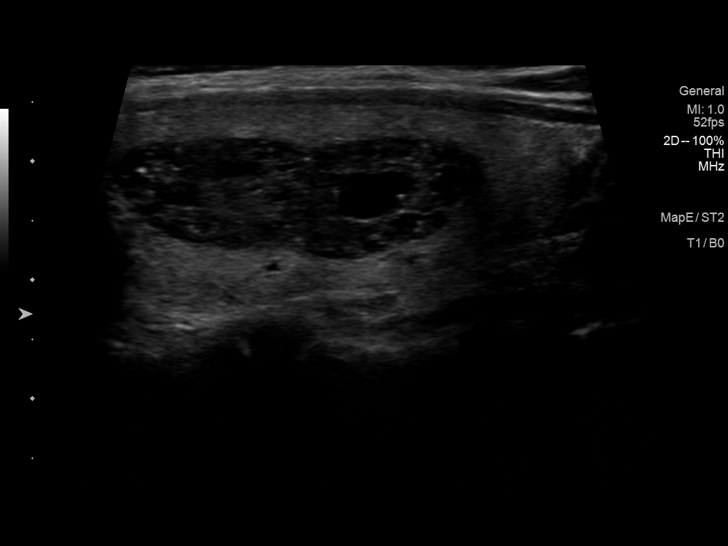
[im 24/55]
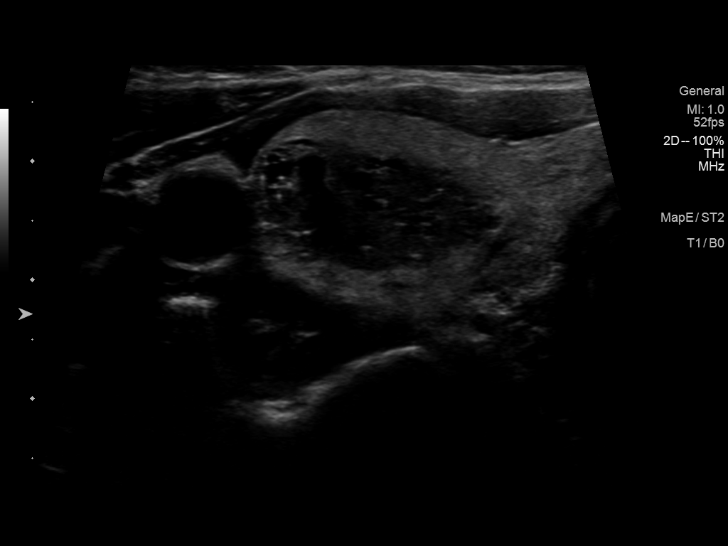
[im 31/55]
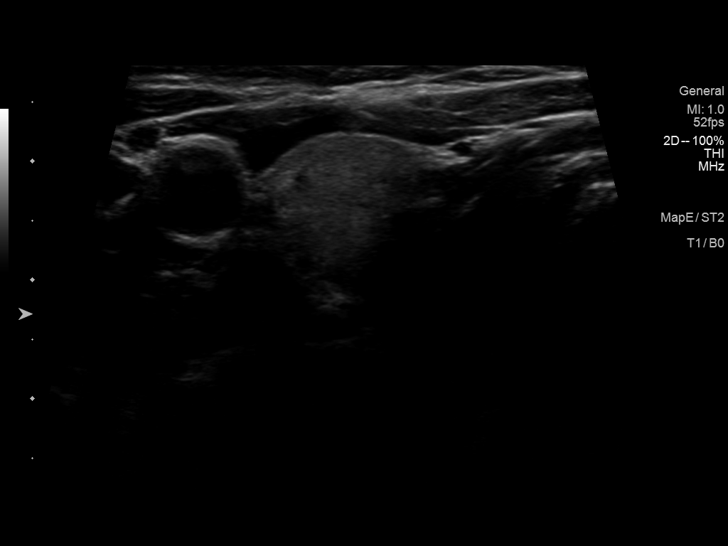
[im 39/55]
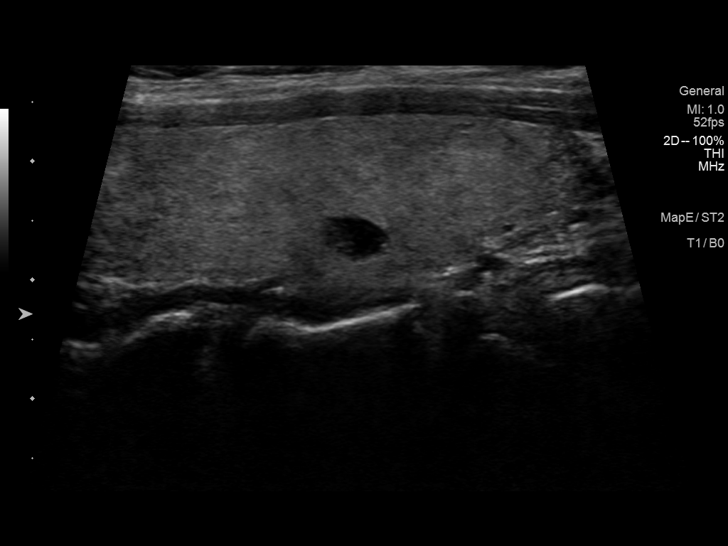
[im 47/55]
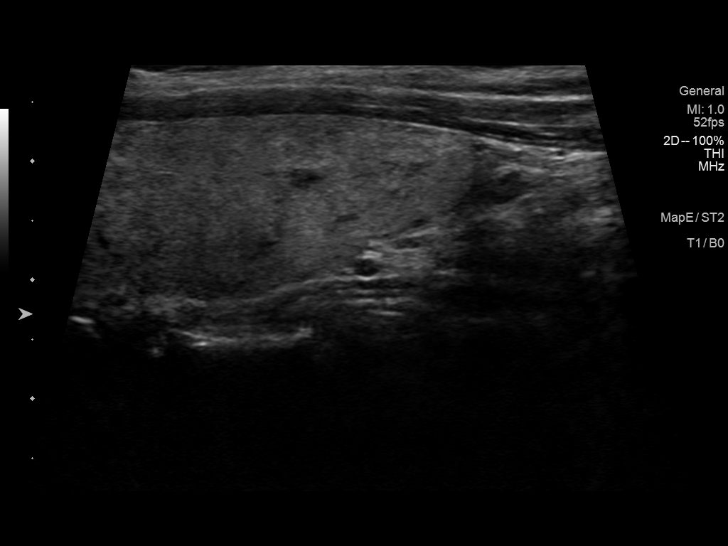
[im 55/55]
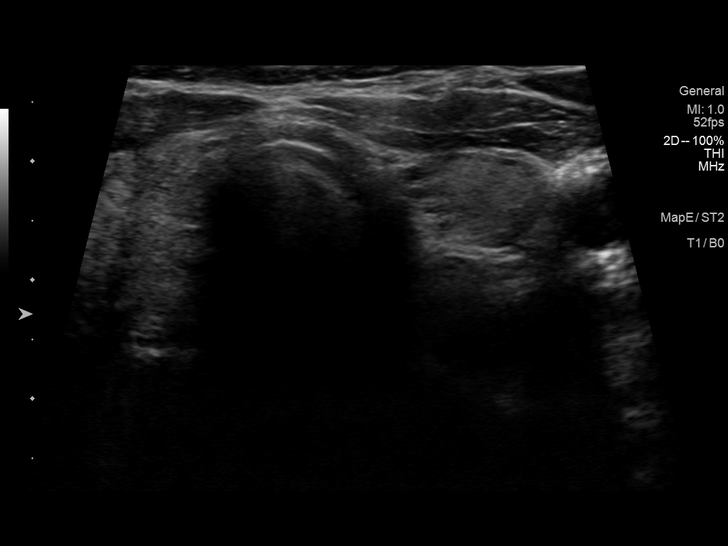

[Series 2: us thyroid · 0.06mm/px · 5 of 39 slices shown (2 of 2)]
[im 5/39]
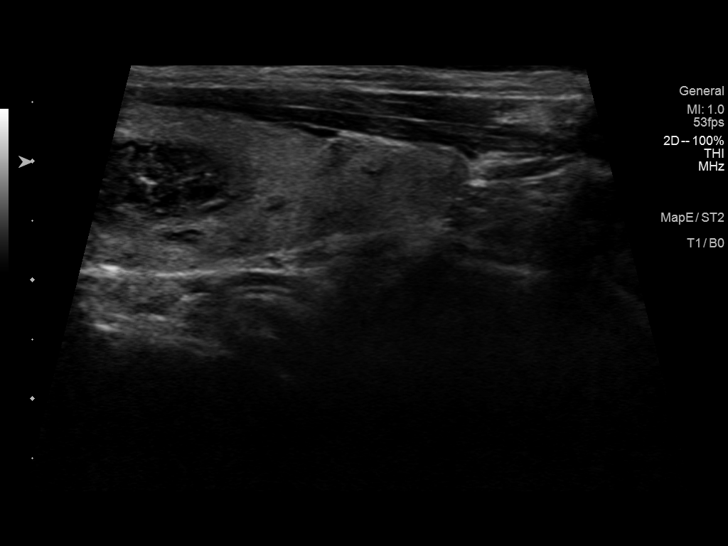
[im 13/39]
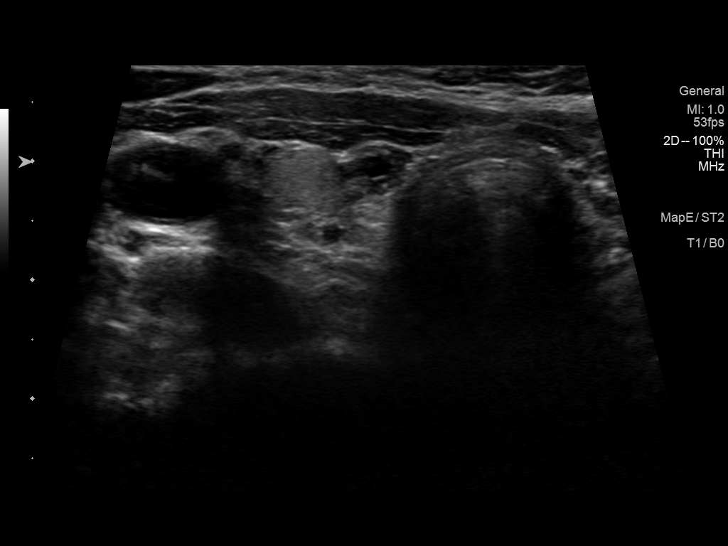
[im 22/39]
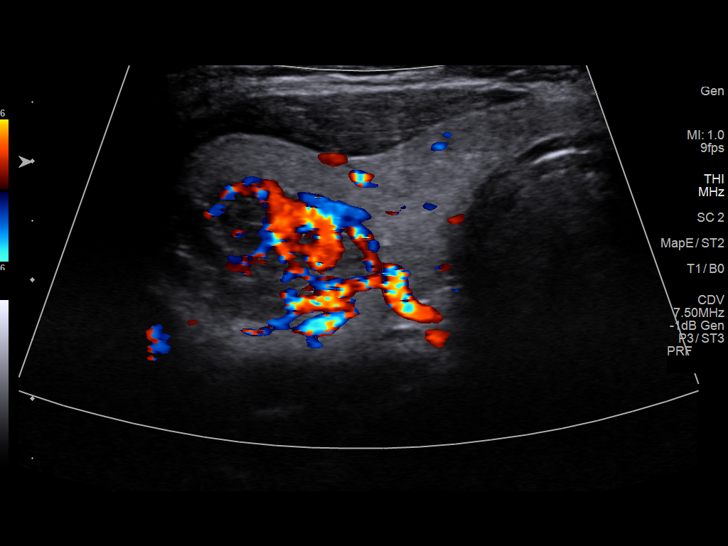
[im 30/39]
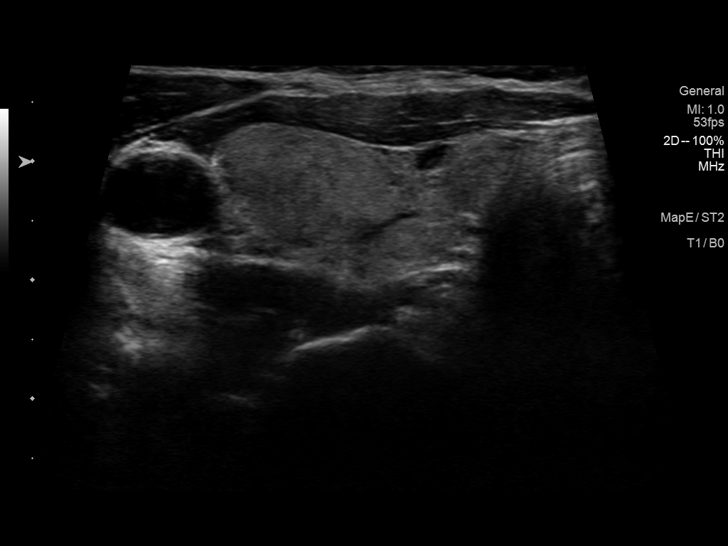
[im 39/39]
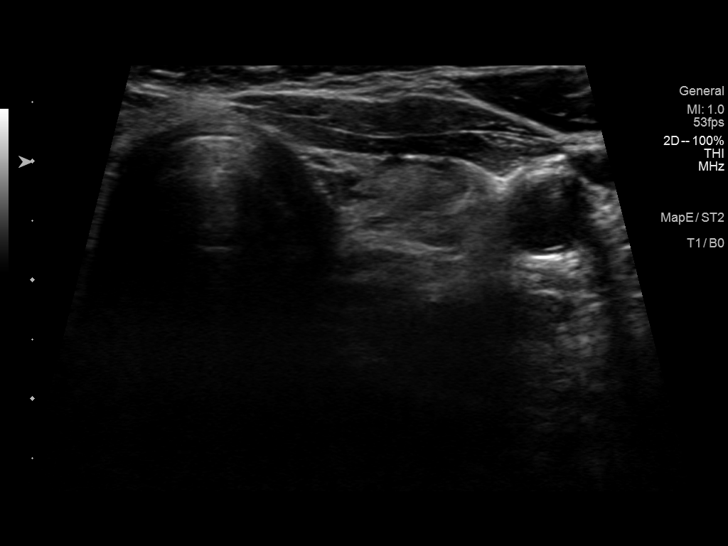

[13 of 25 positions shown; findings below may reference images not displayed]

FINDINGS: Parenchymal Echotexture: Moderately heterogenous

Isthmus: 0.6 cm

Right lobe: 5.7 x 1.6 x 2.6 cm

Left lobe: 4.7 x 1.6 x 2.1 cm

_________________________________________________________

Estimated total number of nodules >/= 1 cm: 2

Number of spongiform nodules >/=  2 cm not described below (TR1): 0

Number of mixed cystic and solid nodules >/= 1.5 cm not described
below (TR2): 0

_________________________________________________________

Nodule # 1:

Location: Right; Mid

Maximum size: 3.2 cm; Other 2 dimensions: 1.3 x 2 cm

Composition: mixed cystic and solid (1)

Echogenicity: hypoechoic (2)

Shape: not taller-than-wide (0)

Margins: smooth (0)

Echogenic foci: punctate echogenic foci (3)

ACR TI-RADS total points: 6.

ACR TI-RADS risk category: TR4 (4-6 points).

ACR TI-RADS recommendations:

**Given size (>/= 1.5 cm) and appearance, fine needle aspiration of
this moderately suspicious nodule should be considered based on
TI-RADS criteria.

_________________________________________________________

Nodule # 2:

Location: Right; Mid

Maximum size: 0.9 cm; Other 2 dimensions: 0.7 x 0.9 cm

Composition: solid/almost completely solid (2)

Echogenicity: hypoechoic (2)

Shape: not taller-than-wide (0)

Margins: smooth (0)

Echogenic foci: none (0)

ACR TI-RADS total points: 3.

ACR TI-RADS risk category: TR3 (3 points).

ACR TI-RADS recommendations:

Given size (<1.4 cm) and appearance, this nodule does NOT meet
TI-RADS criteria for biopsy or dedicated follow-up.

_________________________________________________________

Nodule # 3:

Location: Right; Inferior

Maximum size: 1.3 cm; Other 2 dimensions: 0.7 x 0.9 cm

Composition: solid/almost completely solid (2)

Echogenicity: isoechoic (1)

Shape: not taller-than-wide (0)

Margins: smooth (0)

Echogenic foci: none (0)

ACR TI-RADS total points: 3.

ACR TI-RADS risk category: TR3 (3 points).

ACR TI-RADS recommendations:

Given size (<1.4 cm) and appearance, this nodule does NOT meet
TI-RADS criteria for biopsy or dedicated follow-up.

_________________________________________________________

There are no pathologically enlarged lymph nodes.
IMPRESSION: 1. Borderline enlarged heterogeneous thyroid gland with multiple
thyroid nodules as detailed above.
2. There is a 3.2 cm TR4 thyroid nodule in the right mid thyroid
gland. Follow-up with FNA is recommended for this thyroid nodule.

The above is in keeping with the ACR TI-RADS recommendations - [HOSPITAL] 9605;[DATE].

## 2020-05-28 ENCOUNTER — Other Ambulatory Visit: Payer: Self-pay | Admitting: Obstetrics and Gynecology

## 2020-06-17 NOTE — H&P (Addendum)
Pamela Hunt is a 45 y.o. female 45 YO female, P: 3-0-0-3, presents for sterilization because of her desire to no longer bear children. The patient has used oral contraceptives, the IUD, Nexplanon and Depo Provera in the past for contraception but now wants a permanent option. Her menstrual periods are monthly, accompanied by menstrual migraines and will last for 3 days. She only has to change her pad 3 times a day and has cramping (rated 5/10) that is relieved with Ibuprofen 400 mg or Midol. She denies any changes in her bowel or bladder function, dyspareunia, vaginitis symptoms, pelvic pain or lower back pain. Given her decision to cease childbearing the patient would like to proceed with tubal sterilization.   Past Medical History  OB History: G: 3; P: 3-0-0-3; SVB: 2003, 2004 and 2016  GYN History: menarche: 45 YO;   LMP: 06/12/2020;    Contraception: none; Has a history of CIN-1.  Last PAP smear: 2020 was normal  Medical History: Asthma, GERD, Menstrual Migraine, Rheumatoid Arthritis, Pregnancy Induced Hypertension, Anxiety and Pneumonia.  Surgical History:  2016  Gastric By-Pass Surgery Denies problems with anesthesia or history of blood transfusions  Family History: Thyroid Disease, Cancer and Cardiovascular Disease  Social History: Married and employed as a Surveyor, minerals;  Denies tobacco use but occasionally uses alcohol.   Medications: Alprazolam 0.5 mg daily prn Wellbutrin HCL  XL  300 mg daily Amitriptyline 25 mg 1-2 tablets qhs prn Fioricet as directed prn Promethazine 25 mg every 6 hours prn Phentermine 30 mg daily (stopped prior to surgery)   Allergies  Allergen Reactions  . Tetracyclines & Related Anaphylaxis  . Tomato Itching and Swelling    Denies sensitivity to peanuts, shellfish, soy, latex or adhesives.   ROS: Admits to glasses/contact lenses, menstrual migraines, occasional nausea, ankle/wrist/finger and knee swelling but ;  denies  vision  changes, nasal congestion, dysphagia, tinnitus, dizziness, hoarseness, cough,  chest pain, shortness of breath,  vomiting, diarrhea,constipation,  urinary frequency, urgency  dysuria, hematuria, vaginitis symptoms, pelvic pain, easy bruising,  myalgias, arthralgias, skin rashes, unexplained weight loss and except as is mentioned in the history of present illness, patient's review of systems is otherwise negative.     Physical Exam  Bp: 130/72; P: 85 bpm; R: 14; Weight: 269 lbs.;   Height: 5'7";  BMI: 42.1; O2Sat.: 100% (room air) Neck: supple without masses or thyromegaly Lungs: clear to auscultation Heart: regular rate and rhythm Abdomen: soft, non-tender and no organomegaly Pelvic:EGBUS- wnl; vagina-normal rugae; uterus-normal size, cervix without lesions or motion tenderness; adnexae-no tenderness or masses Extremities:  no clubbing, cyanosis or edema   Assesment: Desire for Sterilization   Disposition:  A discussion was held with patient regarding the indication for her procedure(s) along with the risks, which include but are not limited to: reaction to anesthesia, damage to adjacent organs, infection and excessive bleeding. The patient verbalized understanding of these risks and has consented to proceed with a Laparoscopic Bilateral Salpingectomy at [facility] on June 23, 2020 at 9:45 a.m.   CSN# [id number]   Camron Essman J. Lowell Guitar, PA-C  for Dr. Pierre Bali. Dillard

## 2020-06-18 ENCOUNTER — Other Ambulatory Visit: Payer: Self-pay

## 2020-06-18 ENCOUNTER — Encounter (HOSPITAL_BASED_OUTPATIENT_CLINIC_OR_DEPARTMENT_OTHER): Payer: Self-pay | Admitting: Obstetrics and Gynecology

## 2020-06-18 NOTE — Progress Notes (Signed)
Spoke w/ via phone for pre-op interview--pt Lab needs dos----cbc urine preg               Lab results------none COVID test ------06-19-2020 1445 Arrive at -------800 am 06-23-2020 NPO after MN NO Solid Food.  Clear liquids from MN until---700 am then npo Medications to take morning of surgery -----wellbutrin Diabetic medication -----n/a Patient Special Instructions -----none Pre-Op special Istructions -----none Patient verbalized understanding of instructions that were given at this phone interview. Patient denies shortness of breath, chest pain, fever, cough at this phone interview.

## 2020-06-19 ENCOUNTER — Other Ambulatory Visit (HOSPITAL_COMMUNITY)
Admission: RE | Admit: 2020-06-19 | Discharge: 2020-06-19 | Disposition: A | Discharge status: Home / Self Care | Payer: 59 | Source: Ambulatory Visit | Attending: Obstetrics and Gynecology | Admitting: Obstetrics and Gynecology

## 2020-06-19 DIAGNOSIS — Z20822 Contact with and (suspected) exposure to covid-19: Secondary | ICD-10-CM | POA: Insufficient documentation

## 2020-06-19 DIAGNOSIS — Z01812 Encounter for preprocedural laboratory examination: Secondary | ICD-10-CM | POA: Diagnosis not present

## 2020-06-20 LAB — SARS CORONAVIRUS 2 (TAT 6-24 HRS): SARS Coronavirus 2: NEGATIVE

## 2020-06-23 ENCOUNTER — Other Ambulatory Visit: Payer: Self-pay

## 2020-06-23 ENCOUNTER — Encounter (HOSPITAL_BASED_OUTPATIENT_CLINIC_OR_DEPARTMENT_OTHER)
Admission: RE | Disposition: A | Discharge status: Home / Self Care | Payer: Self-pay | Source: Home / Self Care | Attending: Obstetrics and Gynecology

## 2020-06-23 ENCOUNTER — Ambulatory Visit (HOSPITAL_BASED_OUTPATIENT_CLINIC_OR_DEPARTMENT_OTHER)
Admission: RE | Admit: 2020-06-23 | Discharge: 2020-06-23 | Disposition: A | Discharge status: Home / Self Care | Payer: 59 | Attending: Obstetrics and Gynecology | Admitting: Obstetrics and Gynecology

## 2020-06-23 ENCOUNTER — Ambulatory Visit (HOSPITAL_BASED_OUTPATIENT_CLINIC_OR_DEPARTMENT_OTHER): Payer: 59 | Admitting: Certified Registered"

## 2020-06-23 ENCOUNTER — Encounter (HOSPITAL_BASED_OUTPATIENT_CLINIC_OR_DEPARTMENT_OTHER): Payer: Self-pay | Admitting: Obstetrics and Gynecology

## 2020-06-23 DIAGNOSIS — N858 Other specified noninflammatory disorders of uterus: Secondary | ICD-10-CM | POA: Diagnosis not present

## 2020-06-23 DIAGNOSIS — Z91018 Allergy to other foods: Secondary | ICD-10-CM | POA: Diagnosis not present

## 2020-06-23 DIAGNOSIS — Z881 Allergy status to other antibiotic agents status: Secondary | ICD-10-CM | POA: Insufficient documentation

## 2020-06-23 DIAGNOSIS — Z302 Encounter for sterilization: Secondary | ICD-10-CM

## 2020-06-23 DIAGNOSIS — N838 Other noninflammatory disorders of ovary, fallopian tube and broad ligament: Secondary | ICD-10-CM | POA: Diagnosis not present

## 2020-06-23 DIAGNOSIS — Z87892 Personal history of anaphylaxis: Secondary | ICD-10-CM | POA: Insufficient documentation

## 2020-06-23 DIAGNOSIS — Z79899 Other long term (current) drug therapy: Secondary | ICD-10-CM | POA: Insufficient documentation

## 2020-06-23 HISTORY — DX: Presence of spectacles and contact lenses: Z97.3

## 2020-06-23 HISTORY — DX: Personal history of other complications of pregnancy, childbirth and the puerperium: Z87.59

## 2020-06-23 HISTORY — DX: Unspecified osteoarthritis, unspecified site: M19.90

## 2020-06-23 HISTORY — PX: LAPAROSCOPIC BILATERAL SALPINGECTOMY: SHX5889

## 2020-06-23 HISTORY — DX: Anxiety disorder, unspecified: F41.9

## 2020-06-23 LAB — CBC
HCT: 32 % — ABNORMAL LOW (ref 36.0–46.0)
Hemoglobin: 9.9 g/dL — ABNORMAL LOW (ref 12.0–15.0)
MCH: 27 pg (ref 26.0–34.0)
MCHC: 30.9 g/dL (ref 30.0–36.0)
MCV: 87.4 fL (ref 80.0–100.0)
Platelets: 240 10*3/uL (ref 150–400)
RBC: 3.66 MIL/uL — ABNORMAL LOW (ref 3.87–5.11)
RDW: 15.9 % — ABNORMAL HIGH (ref 11.5–15.5)
WBC: 5.5 10*3/uL (ref 4.0–10.5)
nRBC: 0 % (ref 0.0–0.2)

## 2020-06-23 LAB — POCT PREGNANCY, URINE: Preg Test, Ur: NEGATIVE

## 2020-06-23 SURGERY — SALPINGECTOMY, BILATERAL, LAPAROSCOPIC
Anesthesia: General | Laterality: Bilateral

## 2020-06-23 MED ORDER — OXYCODONE-ACETAMINOPHEN 5-325 MG PO TABS: ORAL_TABLET | ORAL | 0 refills | Status: DC

## 2020-06-23 MED ORDER — OXYCODONE HCL 5 MG/5ML PO SOLN: 5.0000 mg | Freq: Once | ORAL | Status: AC | PRN

## 2020-06-23 MED ORDER — PROPOFOL 10 MG/ML IV BOLUS
INTRAVENOUS | Status: DC | PRN
  Administered 2020-06-23: 200 mg via INTRAVENOUS

## 2020-06-23 MED ORDER — ROCURONIUM BROMIDE 10 MG/ML (PF) SYRINGE
PREFILLED_SYRINGE | INTRAVENOUS | Status: DC | PRN
  Administered 2020-06-23: 60 mg via INTRAVENOUS

## 2020-06-23 MED ORDER — LIDOCAINE 2% (20 MG/ML) 5 ML SYRINGE
INTRAMUSCULAR | Status: AC
  Filled 2020-06-23: qty 5

## 2020-06-23 MED ORDER — SUGAMMADEX SODIUM 200 MG/2ML IV SOLN
INTRAVENOUS | Status: DC | PRN
  Administered 2020-06-23: 500 mg via INTRAVENOUS

## 2020-06-23 MED ORDER — CEFAZOLIN SODIUM-DEXTROSE 2-4 GM/100ML-% IV SOLN
2.0000 g | INTRAVENOUS | Status: AC
  Administered 2020-06-23: 2 g via INTRAVENOUS

## 2020-06-23 MED ORDER — IBUPROFEN 600 MG PO TABS: ORAL_TABLET | ORAL | 1 refills | Status: DC

## 2020-06-23 MED ORDER — CEFAZOLIN SODIUM-DEXTROSE 2-4 GM/100ML-% IV SOLN
INTRAVENOUS | Status: AC
  Filled 2020-06-23: qty 100

## 2020-06-23 MED ORDER — DEXAMETHASONE SODIUM PHOSPHATE 10 MG/ML IJ SOLN
INTRAMUSCULAR | Status: DC | PRN
  Administered 2020-06-23: 5 mg via INTRAVENOUS

## 2020-06-23 MED ORDER — BUPIVACAINE HCL 0.5 % IJ SOLN
INTRAMUSCULAR | Status: DC | PRN
  Administered 2020-06-23: 11 mL

## 2020-06-23 MED ORDER — MIDAZOLAM HCL 2 MG/2ML IJ SOLN
INTRAMUSCULAR | Status: AC
  Filled 2020-06-23: qty 2

## 2020-06-23 MED ORDER — LIDOCAINE 2% (20 MG/ML) 5 ML SYRINGE
INTRAMUSCULAR | Status: DC | PRN
  Administered 2020-06-23: 80 mg via INTRAVENOUS

## 2020-06-23 MED ORDER — DEXAMETHASONE SODIUM PHOSPHATE 10 MG/ML IJ SOLN
INTRAMUSCULAR | Status: AC
  Filled 2020-06-23: qty 1

## 2020-06-23 MED ORDER — LACTATED RINGERS IV SOLN
INTRAVENOUS | Status: DC
  Administered 2020-06-23: 10001 mL via INTRAVENOUS

## 2020-06-23 MED ORDER — FENTANYL CITRATE (PF) 100 MCG/2ML IJ SOLN
INTRAMUSCULAR | Status: AC
  Filled 2020-06-23: qty 2

## 2020-06-23 MED ORDER — GLYCOPYRROLATE PF 0.2 MG/ML IJ SOSY
PREFILLED_SYRINGE | INTRAMUSCULAR | Status: DC | PRN
  Administered 2020-06-23: .2 mg via INTRAVENOUS

## 2020-06-23 MED ORDER — PROMETHAZINE HCL 25 MG/ML IJ SOLN: 6.2500 mg | INTRAMUSCULAR | Status: DC | PRN

## 2020-06-23 MED ORDER — POVIDONE-IODINE 10 % EX SWAB: 2.0000 "application " | Freq: Once | CUTANEOUS | Status: DC

## 2020-06-23 MED ORDER — ONDANSETRON HCL 4 MG/2ML IJ SOLN
INTRAMUSCULAR | Status: AC
  Filled 2020-06-23: qty 2

## 2020-06-23 MED ORDER — MIDAZOLAM HCL 2 MG/2ML IJ SOLN
INTRAMUSCULAR | Status: DC | PRN
  Administered 2020-06-23: 2 mg via INTRAVENOUS

## 2020-06-23 MED ORDER — ONDANSETRON HCL 4 MG/2ML IJ SOLN
INTRAMUSCULAR | Status: DC | PRN
  Administered 2020-06-23: 4 mg via INTRAVENOUS

## 2020-06-23 MED ORDER — HYDROMORPHONE HCL 1 MG/ML IJ SOLN
0.2500 mg | INTRAMUSCULAR | Status: DC | PRN
Start: 2020-06-23 — End: 2020-06-23

## 2020-06-23 MED ORDER — KETOROLAC TROMETHAMINE 30 MG/ML IJ SOLN
INTRAMUSCULAR | Status: DC | PRN
  Administered 2020-06-23: 30 mg via INTRAVENOUS

## 2020-06-23 MED ORDER — FENTANYL CITRATE (PF) 100 MCG/2ML IJ SOLN
INTRAMUSCULAR | Status: DC | PRN
  Administered 2020-06-23: 50 ug via INTRAVENOUS
  Administered 2020-06-23 (×4): 25 ug via INTRAVENOUS
  Administered 2020-06-23: 50 ug via INTRAVENOUS

## 2020-06-23 MED ORDER — OXYCODONE HCL 5 MG PO TABS
ORAL_TABLET | ORAL | Status: AC
  Filled 2020-06-23: qty 1

## 2020-06-23 MED ORDER — PROPOFOL 10 MG/ML IV BOLUS
INTRAVENOUS | Status: AC
  Filled 2020-06-23: qty 40

## 2020-06-23 MED ORDER — ROCURONIUM BROMIDE 10 MG/ML (PF) SYRINGE
PREFILLED_SYRINGE | INTRAVENOUS | Status: AC
  Filled 2020-06-23: qty 10

## 2020-06-23 MED ORDER — OXYCODONE HCL 5 MG PO TABS
5.0000 mg | ORAL_TABLET | Freq: Once | ORAL | Status: AC | PRN
  Administered 2020-06-23: 5 mg via ORAL

## 2020-06-23 MED ORDER — KETOROLAC TROMETHAMINE 30 MG/ML IJ SOLN
INTRAMUSCULAR | Status: AC
  Filled 2020-06-23: qty 1

## 2020-06-23 SURGICAL SUPPLY — 22 items
ADH SKN CLS APL DERMABOND .7 (GAUZE/BANDAGES/DRESSINGS) ×1
COVER MAYO STAND STRL (DRAPES) ×2 IMPLANT
DERMABOND ADVANCED (GAUZE/BANDAGES/DRESSINGS) ×1
DERMABOND ADVANCED .7 DNX12 (GAUZE/BANDAGES/DRESSINGS) ×1 IMPLANT
DURAPREP 26ML APPLICATOR (WOUND CARE) ×2 IMPLANT
GAUZE 4X4 16PLY RFD (DISPOSABLE) ×2 IMPLANT
GLOVE SURG ENC MOIS LTX SZ6.5 (GLOVE) ×2 IMPLANT
GLOVE SURG UNDER POLY LF SZ7 (GLOVE) ×6 IMPLANT
GOWN STRL REUS W/TWL LRG LVL3 (GOWN DISPOSABLE) ×4 IMPLANT
KIT TURNOVER CYSTO (KITS) ×2 IMPLANT
PACK LAPAROSCOPY BASIN (CUSTOM PROCEDURE TRAY) ×2 IMPLANT
PACK TRENDGUARD 450 HYBRID PRO (MISCELLANEOUS) ×1 IMPLANT
SET SUCTION IRRIG HYDROSURG (IRRIGATION / IRRIGATOR) IMPLANT
SET TUBE SMOKE EVAC HIGH FLOW (TUBING) ×2 IMPLANT
SUT MNCRL AB 4-0 PS2 18 (SUTURE) ×2 IMPLANT
SUT VICRYL 0 UR6 27IN ABS (SUTURE) ×2 IMPLANT
TOWEL OR 17X26 10 PK STRL BLUE (TOWEL DISPOSABLE) ×2 IMPLANT
TRAY FOLEY W/BAG SLVR 14FR LF (SET/KITS/TRAYS/PACK) ×2 IMPLANT
TRENDGUARD 450 HYBRID PRO PACK (MISCELLANEOUS) ×2
TROCAR BALLN 12MMX100 BLUNT (TROCAR) ×2 IMPLANT
TROCAR BLADELESS OPT 5 100 (ENDOMECHANICALS) ×4 IMPLANT
WARMER LAPAROSCOPE (MISCELLANEOUS) ×2 IMPLANT

## 2020-06-23 NOTE — Discharge Instructions (Signed)
Call [facility] @ [phone] if:  You have a temperature greater than or equal to 100.4 degrees Farenheit orally You have pain that is not made better by the pain medication given and taken as directed You have excessive bleeding or problems urinating  Take Colace (Docusate Sodium/Stool Softener) 100 mg 2-3 times daily while taking narcotic pain medicine to avoid constipation or until bowel movements are regular.  Take Ibuprofen 600 mg with food every 6 hours for 5 days then as needed for post operative pain  You may drive after 36 hours You may walk up steps  You may shower tomorrow You may resume a regular diet  Keep incisions clean and dry Do not lift over 15 pounds for 6 weeks Avoid anything in vagina  until after your post-operative visit   Post Anesthesia Home Care Instructions  Activity: Get plenty of rest for the remainder of the day. A responsible individual must stay with you for 24 hours following the procedure.  For the next 24 hours, DO NOT: -Drive a car -Advertising copywriter -Drink alcoholic beverages -Take any medication unless instructed by your physician -Make any legal decisions or sign important papers.  Meals: Start with liquid foods such as gelatin or soup. Progress to regular foods as tolerated. Avoid greasy, spicy, heavy foods. If nausea and/or vomiting occur, drink only clear liquids until the nausea and/or vomiting subsides. Call your physician if vomiting continues.  Special Instructions/Symptoms: Your throat may feel dry or sore from the anesthesia or the breathing tube placed in your throat during surgery. If this causes discomfort, gargle with warm salt water. The discomfort should disappear within 24 hours.  If you had a scopolamine patch placed behind your ear for the management of post- operative nausea and/or vomiting:  1. The medication in the patch is effective for 72 hours, after which it should be removed.  Wrap patch in a  tissue and discard in the trash. Wash hands thoroughly with soap and water. 2. You may remove the patch earlier than 72 hours if you experience unpleasant side effects which may include dry mouth, dizziness or visual disturbances. 3. Avoid touching the patch. Wash your hands with soap and water after contact with the patch.

## 2020-06-23 NOTE — Op Note (Signed)
dications: Pamela Hunt  is a 45 y.o. female with diagnosis of multiparity.  Pre-operative Diagnosis: Multiparity desires sterilization  Post-operative Diagnosis: same  Surgeon: [id number],OXBDZ A   Assistants: Henreitta Leber PA  Anesthesia: General endotracheal anesthesia   Procedure : Laparoscopic Bilateral Salpingectomy  Procedure Details  The patient was seen in the Holding Room. The risks, benefits, complications, treatment options, and expected outcomes were discussed with the patient. The possibilities of reaction to medication, pulmonary aspiration, perforation of viscus, bleeding, recurrent infection, the need for additional procedures, failure to diagnose a condition, and creating a complication requiring transfusion or operation were discussed with the patient. The patient concurred with the proposed plan, giving informed consent. The patient was taken to the Operating Room, identified as Pamela Hunt and the procedure verified as Diagnostic Laparoscopy with B sallpingecotmy. A Time Out was held and the above information confirmed.  After induction of general anesthesia, the patient was placed in modified dorsal lithotomy position where she was prepped, draped, and catheterized in the normal, sterile fashion. A foley catheter was placed..  The cervix was visualized and an intrauterine manipulator was placed. A 2 cm umbilical incision was then performed.and carried down to the fascia.  The fascia was then opened and extended bilaterally.  Peritoneum was then entered.  o vicryl was then placed around the fascia in a circumferential fashion.   The hasson was placed and ancored to the suture.. Normal pelvic anatomy was noted.  The uterus had a chocolate appearing lesion on it .  C/w endometriosis.  The tubes, ovaries and appendix apperared normal.   The anterior and  Posterior culdesac and liver appeared normal.     Two 36mm trocars were placed in the right and left lower quadrants  of the abdomen under direct visualization of the laparoscope.  The tubes were removed and sent to pathology using the harmonic scalpel.  The chocolate cyst was removed from the uterus with pick ups with teeth.  Hemostasis was obtained with kleppinger.   Hemostasis was noted.  Air was allowed to leave the abdomen.  The abdomen was reinsufflated and hemostasis was still noted.  The tubes were removed through the 10 port using the 30mm scope in the RLQ.  The 10 scope was then used again.     Following the procedure the umbilical hasson was removed after intra-abdominal carbon dioxide was expressed. The fascia was reaproximated by tying the 0 vicryl suture. The SQ space was closed with an interrupted suture of 0 vicryl      The 50mm skin incision was closed with dermabond.  The 10 mm incision was closed with a  subcuticular suture of 3-0 monocryl. The intrauterine manipulator was then removed.  The tenaculum site was   hemostatic  Instrument, sponge, and needle counts were correct prior to abdominal closure and at the conclusion of the case.  Findings: See above Estimated Blood Loss:  Minimal  Urine output 200 cc         Drains: none         Total IV Fluids:Intravenous fluids were administered, normal saline  m         Specimens: tubes, and uterine biopsy         Complications:  None; patient tolerated the procedure well.         Disposition: PACU - hemodynamically stable.         Condition: stable

## 2020-06-23 NOTE — Anesthesia Procedure Notes (Addendum)
Procedure Name: Intubation Date/Time: 06/23/2020 10:08 AM Performed by: Suan Halter, CRNA Pre-anesthesia Checklist: Patient identified, Emergency Drugs available, Suction available and Patient being monitored Patient Re-evaluated:Patient Re-evaluated prior to induction Oxygen Delivery Method: Circle system utilized Preoxygenation: Pre-oxygenation with 100% oxygen Induction Type: IV induction Ventilation: Mask ventilation without difficulty Laryngoscope Size: Mac and 3 Grade View: Grade I Tube type: Oral Tube size: 7.0 mm Number of attempts: 1 Airway Equipment and Method: Stylet and Oral airway Placement Confirmation: ETT inserted through vocal cords under direct vision,  positive ETCO2 and breath sounds checked- equal and bilateral Secured at: 22 cm Tube secured with: Tape Dental Injury: Teeth and Oropharynx as per pre-operative assessment

## 2020-06-23 NOTE — Transfer of Care (Signed)
Immediate Anesthesia Transfer of Care Note  Immediate Anesthesia Transfer of Care Note  Patient: Pamela Hunt  Procedure(s) Performed: Procedure(s) (LRB): LAPAROSCOPIC BILATERAL SALPINGECTOMY (Bilateral)  Patient Location: PACU  Anesthesia Type: General  Level of Consciousness: awake, oriented, sedated and patient cooperative  Airway & Oxygen Therapy: Patient Spontanous Breathing and Patient connected to face mask oxygen  Post-op Assessment: Report given to PACU RN and Post -op Vital signs reviewed and stable  Post vital signs: Reviewed and stable  Complications: No apparent anesthesia complications  Last Vitals:  Vitals Value Taken Time  BP 134/91 06/23/20 1123  Temp    Pulse 72 06/23/20 1126  Resp 12 06/23/20 1126  SpO2 95 % 06/23/20 1126  Vitals shown include unvalidated device data.  Last Pain:  Vitals:   06/23/20 0832  TempSrc: Oral  PainSc: 0-No pain      Patients Stated Pain Goal: 7 (06/23/20 2449)  Complications: No complications documented.

## 2020-06-23 NOTE — Anesthesia Preprocedure Evaluation (Addendum)
Anesthesia Evaluation  Patient identified by MRN, date of birth, ID band Patient awake    Reviewed: Allergy & Precautions, NPO status , Patient's Chart, lab work & pertinent test results  Airway Mallampati: II  TM Distance: >3 FB Neck ROM: Full    Dental no notable dental hx. (+) Dental Advisory Given   Pulmonary asthma , pneumonia,    Pulmonary exam normal breath sounds clear to auscultation       Cardiovascular hypertension, Normal cardiovascular exam Rhythm:Regular Rate:Normal     Neuro/Psych  Headaches, PSYCHIATRIC DISORDERS Anxiety    GI/Hepatic Neg liver ROS, GERD  ,  Endo/Other  Morbid obesity  Renal/GU negative Renal ROS     Musculoskeletal  (+) Arthritis ,   Abdominal (+) + obese,   Peds  Hematology negative hematology ROS (+)   Anesthesia Other Findings   Reproductive/Obstetrics                            Anesthesia Physical Anesthesia Plan  ASA: III  Anesthesia Plan: General   Post-op Pain Management:    Induction: Intravenous  PONV Risk Score and Plan: 4 or greater and Ondansetron, Dexamethasone, Midazolam, Scopolamine patch - Pre-op, Diphenhydramine and Treatment may vary due to age or medical condition  Airway Management Planned: Oral ETT  Additional Equipment:   Intra-op Plan:   Post-operative Plan: Extubation in OR  Informed Consent: I have reviewed the patients History and Physical, chart, labs and discussed the procedure including the risks, benefits and alternatives for the proposed anesthesia with the patient or authorized representative who has indicated his/her understanding and acceptance.     Dental advisory given  Plan Discussed with: CRNA  Anesthesia Plan Comments:        Anesthesia Quick Evaluation

## 2020-06-23 NOTE — Anesthesia Postprocedure Evaluation (Signed)
Anesthesia Post Note  Patient: Pamela Hunt  Procedure(s) Performed: LAPAROSCOPIC BILATERAL SALPINGECTOMY (Bilateral )     Patient location during evaluation: PACU Anesthesia Type: General Level of consciousness: sedated and patient cooperative Pain management: pain level controlled Vital Signs Assessment: post-procedure vital signs reviewed and stable Respiratory status: spontaneous breathing Cardiovascular status: stable Anesthetic complications: no   No complications documented.  Last Vitals:  Vitals:   06/23/20 1145 06/23/20 1200  BP: 128/82 131/89  Pulse: 76 (!) 55  Resp: 15 12  Temp:  (!) 36.4 C  SpO2: 99% 99%    Last Pain:  Vitals:   06/23/20 1233  TempSrc:   PainSc: 5                  Lewie Loron

## 2020-06-24 ENCOUNTER — Encounter (HOSPITAL_BASED_OUTPATIENT_CLINIC_OR_DEPARTMENT_OTHER): Payer: Self-pay | Admitting: Obstetrics and Gynecology

## 2020-06-24 LAB — SURGICAL PATHOLOGY

## 2020-08-09 ENCOUNTER — Encounter (INDEPENDENT_AMBULATORY_CARE_PROVIDER_SITE_OTHER): Payer: Self-pay

## 2020-09-09 ENCOUNTER — Other Ambulatory Visit: Payer: Self-pay

## 2020-09-09 ENCOUNTER — Encounter (INDEPENDENT_AMBULATORY_CARE_PROVIDER_SITE_OTHER): Payer: Self-pay | Admitting: Family Medicine

## 2020-09-09 ENCOUNTER — Ambulatory Visit (INDEPENDENT_AMBULATORY_CARE_PROVIDER_SITE_OTHER): Payer: 59 | Admitting: Family Medicine

## 2020-09-09 VITALS — BP 111/75 | HR 75 | Temp 98.0°F | Ht 67.0 in | Wt 254.0 lb

## 2020-09-09 DIAGNOSIS — Z9189 Other specified personal risk factors, not elsewhere classified: Secondary | ICD-10-CM | POA: Diagnosis not present

## 2020-09-09 DIAGNOSIS — J45909 Unspecified asthma, uncomplicated: Secondary | ICD-10-CM

## 2020-09-09 DIAGNOSIS — R5383 Other fatigue: Secondary | ICD-10-CM | POA: Diagnosis not present

## 2020-09-09 DIAGNOSIS — R0602 Shortness of breath: Secondary | ICD-10-CM

## 2020-09-09 DIAGNOSIS — M069 Rheumatoid arthritis, unspecified: Secondary | ICD-10-CM

## 2020-09-09 DIAGNOSIS — F411 Generalized anxiety disorder: Secondary | ICD-10-CM

## 2020-09-09 DIAGNOSIS — G43809 Other migraine, not intractable, without status migrainosus: Secondary | ICD-10-CM | POA: Diagnosis not present

## 2020-09-09 DIAGNOSIS — F3289 Other specified depressive episodes: Secondary | ICD-10-CM

## 2020-09-09 DIAGNOSIS — Z0289 Encounter for other administrative examinations: Secondary | ICD-10-CM

## 2020-09-09 DIAGNOSIS — Z6839 Body mass index (BMI) 39.0-39.9, adult: Secondary | ICD-10-CM

## 2020-09-09 DIAGNOSIS — R632 Polyphagia: Secondary | ICD-10-CM

## 2020-09-09 DIAGNOSIS — Z9884 Bariatric surgery status: Secondary | ICD-10-CM

## 2020-09-09 DIAGNOSIS — Z9851 Tubal ligation status: Secondary | ICD-10-CM

## 2020-09-10 LAB — CBC WITH DIFFERENTIAL/PLATELET
Basophils Absolute: 0 10*3/uL (ref 0.0–0.2)
Basos: 1 %
EOS (ABSOLUTE): 0.1 10*3/uL (ref 0.0–0.4)
Eos: 3 %
Hemoglobin: 9.6 g/dL — ABNORMAL LOW (ref 11.1–15.9)
Immature Grans (Abs): 0 10*3/uL (ref 0.0–0.1)
Immature Granulocytes: 0 %
Lymphocytes Absolute: 1.7 10*3/uL (ref 0.7–3.1)
Lymphs: 35 %
MCH: 27.2 pg (ref 26.6–33.0)
MCHC: 31.3 g/dL — ABNORMAL LOW (ref 31.5–35.7)
MCV: 87 fL (ref 79–97)
Monocytes Absolute: 0.4 10*3/uL (ref 0.1–0.9)
Monocytes: 8 %
Neutrophils Absolute: 2.7 10*3/uL (ref 1.4–7.0)
Neutrophils: 53 %
Platelets: 265 10*3/uL (ref 150–450)
RBC: 3.53 x10E6/uL — ABNORMAL LOW (ref 3.77–5.28)
RDW: 15.2 % (ref 11.7–15.4)
WBC: 4.9 10*3/uL (ref 3.4–10.8)

## 2020-09-10 LAB — COMPREHENSIVE METABOLIC PANEL
ALT: 22 IU/L (ref 0–32)
AST: 25 IU/L (ref 0–40)
Albumin/Globulin Ratio: 1.6 (ref 1.2–2.2)
Albumin: 4.1 g/dL (ref 3.8–4.8)
Alkaline Phosphatase: 101 IU/L (ref 44–121)
BUN/Creatinine Ratio: 9 (ref 9–23)
BUN: 9 mg/dL (ref 6–24)
Bilirubin Total: 0.3 mg/dL (ref 0.0–1.2)
CO2: 19 mmol/L — ABNORMAL LOW (ref 20–29)
Calcium: 8.3 mg/dL — ABNORMAL LOW (ref 8.7–10.2)
Chloride: 106 mmol/L (ref 96–106)
Creatinine, Ser: 1 mg/dL (ref 0.57–1.00)
Globulin, Total: 2.6 g/dL (ref 1.5–4.5)
Glucose: 78 mg/dL (ref 65–99)
Potassium: 4.4 mmol/L (ref 3.5–5.2)
Sodium: 139 mmol/L (ref 134–144)
Total Protein: 6.7 g/dL (ref 6.0–8.5)
eGFR: 72 mL/min/{1.73_m2} (ref >59)

## 2020-09-10 LAB — ANEMIA PANEL
Ferritin: 13 ng/mL — ABNORMAL LOW (ref 15–150)
Folate, Hemolysate: 434 ng/mL
Folate, RBC: 1414 ng/mL (ref >498)
Hematocrit: 30.7 % — ABNORMAL LOW (ref 34.0–46.6)
Iron Saturation: 12 % — ABNORMAL LOW (ref 15–55)
Iron: 45 ug/dL (ref 27–159)
Retic Ct Pct: 1.2 % (ref 0.6–2.6)
Total Iron Binding Capacity: 383 ug/dL (ref 250–450)
UIBC: 338 ug/dL (ref 131–425)
Vitamin B-12: 646 pg/mL (ref 232–1245)

## 2020-09-10 LAB — LIPID PANEL
Chol/HDL Ratio: 2.7 ratio (ref 0.0–4.4)
Cholesterol, Total: 146 mg/dL (ref 100–199)
HDL: 55 mg/dL (ref >39)
LDL Chol Calc (NIH): 81 mg/dL (ref 0–99)
Triglycerides: 43 mg/dL (ref 0–149)
VLDL Cholesterol Cal: 10 mg/dL (ref 5–40)

## 2020-09-10 LAB — VITAMIN D 25 HYDROXY (VIT D DEFICIENCY, FRACTURES): Vit D, 25-Hydroxy: 18.6 ng/mL — ABNORMAL LOW (ref 30.0–100.0)

## 2020-09-10 LAB — INSULIN, RANDOM: INSULIN: 5.4 u[IU]/mL (ref 2.6–24.9)

## 2020-09-10 LAB — HEMOGLOBIN A1C
Est. average glucose Bld gHb Est-mCnc: 117 mg/dL
Hgb A1c MFr Bld: 5.7 % — ABNORMAL HIGH (ref 4.8–5.6)

## 2020-09-10 LAB — TSH: TSH: 0.818 u[IU]/mL (ref 0.450–4.500)

## 2020-09-10 LAB — T4, FREE: Free T4: 1.06 ng/dL (ref 0.82–1.77)

## 2020-09-11 NOTE — Progress Notes (Signed)
Dear Dr. Hyman Hopes,   Thank you for referring Pamela Hunt to our clinic. The following note includes my evaluation and treatment recommendations.  Chief Complaint:   OBESITY Pamela Hunt (MR# [id number]) is a 45 y.o. female who presents for evaluation and treatment of obesity and related comorbidities. Current BMI is Body mass index is 39.78 kg/m. Pamela Hunt has been struggling with her weight for many years and has been unsuccessful in either losing weight, maintaining weight loss, or reaching her healthy weight goal.  Pamela Hunt is currently in the action stage of change and ready to dedicate time achieving and maintaining a healthier weight. Pamela Hunt is interested in becoming our patient and working on intensive lifestyle modifications including (but not limited to) diet and exercise for weight loss.  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  She lives with her husband and 3 sons (65, 85, 6).  She has a busy schedule and eats fast food often.  She says she has a "sweet tooth".   She had labs on 05/26/2020 (A1c was 5.6, FLP, CMP).   Pamela Hunt's habits were reviewed today and are as follows: her desired weight loss is 70 pounds, she has been heavy most of her life, her heaviest weight ever was 360 pounds, she craves cakes, soda, and other sweets, she snacks frequently in the evenings, she is frequently drinking liquids with calories, she frequently makes poor food choices and she struggles with emotional eating.  Pamela Hunt provided the following food recall today: Fast food - Wouldn't finish (usually 1/2 portion).  Lots of sweet tea, soda, cake.  Now back to high protein, low carb.  Depression Screen Pamela Hunt's Food and Mood (modified PHQ-9) score was 4.  Depression screen [facility] 2/9 09/09/2020  Decreased Interest 1  Down, Depressed, Hopeless 1  PHQ - 2 Score 2  Altered sleeping 0  Tired, decreased energy 1  Change in appetite 1  Feeling bad or failure about yourself  0   Trouble concentrating 0  Moving slowly or fidgety/restless 0  Suicidal thoughts 0  PHQ-9 Score 4  Difficult doing work/chores Not difficult at all   Assessment/Plan:   Orders Placed This Encounter  Procedures  . Anemia panel  . CBC with Differential/Platelet  . Comprehensive metabolic panel  . Hemoglobin A1c  . Insulin, random  . Lipid panel  . T4, free  . TSH  . VITAMIN D 25 Hydroxy (Vit-D Deficiency, Fractures)  . EKG 12-Lead   1. Other fatigue Pamela Hunt denies daytime somnolence and denies waking up still tired. Patent has a history of symptoms of morning headache and snoring. Pamela Hunt generally gets 7 or 8 hours of sleep per night, and states that she has generally restful sleep. Snoring is present. Apneic episodes are not present. Epworth Sleepiness Score is 0.  Pamela Hunt does feel that her weight is causing her energy to be lower than it should be. Fatigue may be related to obesity, depression or many other causes. Labs will be ordered, and in the meanwhile, Pamela Hunt will focus on self care including making healthy food choices, increasing physical activity and focusing on stress reduction.  - EKG 12-Lead - Anemia panel - CBC with Differential/Platelet - Comprehensive metabolic panel - Hemoglobin A1c - Insulin, random - Lipid panel - T4, free - TSH  2. SOB (shortness of breath) on exertion Pamela Hunt notes increasing shortness of breath with exercising and seems to be worsening over time with weight gain. She notes getting out of breath  sooner with activity than she used to. This has gotten worse recently. Pamela Hunt denies shortness of breath at rest or orthopnea.  Pamela Hunt does feel that she gets out of breath more easily that she used to when she exercises. Pamela Hunt's shortness of breath appears to be obesity related and exercise induced. She has agreed to work on weight loss and gradually increase exercise to treat her exercise induced shortness of breath. Will continue to  monitor closely.  - Anemia panel - CBC with Differential/Platelet - Comprehensive metabolic panel - Hemoglobin A1c - Insulin, random - Lipid panel - T4, free - TSH  3. Rheumatoid arthritis of both wrists Pamela Hunt has history of taking Humira. Plan:  Will check vitamin D level today.  - VITAMIN D 25 Hydroxy (Vit-D Deficiency, Fractures)  4. Other migraine without status migrainosus, not intractable Pamela Hunt is taking amitriptyline 50 mg at bedtime for migraine prevention.  She also takes butalbital every 4 hours as needed for rescue medication.  5. Polyphagia Not at goal. Current treatment: phentermine 30 mg daily. Polyphagia refers to excessive feelings of hunger. She will continue to focus on protein-rich, low simple carbohydrate foods. We reviewed the importance of hydration, regular exercise for stress reduction, and restorative sleep.  Plan:  Continue phentermine.  Will check labs today.  - Anemia panel - CBC with Differential/Platelet - Comprehensive metabolic panel - Hemoglobin A1c - Insulin, random - Lipid panel - T4, free - TSH  6. Asthma Will follow along closely as it relates to her weight loss journey.  7. History of gastric bypass On 01/27/2015.  High 360, low 217.  She is at risk for malnutrition due to her previous bariatric surgery.   Counseling  You may need to eat 3 meals and 2 snacks, or 5 small meals each day in order to reach your protein and calorie goals.   Allow at least 15 minutes for each meal so that you can eat mindfully. Listen to your body so that you do not overeat. For most people, your sleeve or pouch will comfortably hold 4-6 ounces.  Eat foods from all food groups. This includes fruits and vegetables, grains, dairy, and meat and other proteins.  Include a protein-rich food at every meal and snack, and eat the protein food first.   You should be taking a Bariatric Multivitamin as well as calcium.   - Anemia panel - CBC with  Differential/Platelet - Comprehensive metabolic panel - Hemoglobin A1c - Insulin, random - Lipid panel - T4, free - TSH - VITAMIN D 25 Hydroxy (Vit-D Deficiency, Fractures)  8. History of tubal ligation In March 2022.  9. GAD (generalized anxiety disorder) Linzey takes Wellbutrin XL 300 mg daily and amitriptyline 50 mg at bedtime for anxiety.  10. Other depression, with emotional eating Not at goal. Medication: Wellbutrin XL 300 mg daily and amitriptyline 50 mg at bedtime.  She suspects BED and will read about Vyvanse prior to our next visit.  Plan:  Discussed cues and consequences, how thoughts affect eating, model of thoughts, feelings, and behaviors, and strategies for change by focusing on the cue. Discussed cognitive distortions, coping thoughts, and how to change your thoughts.  11. At risk for malnutrition Pamela Hunt was given extensive malnutrition prevention education and counseling today of more than 8 minutes.  Counseled her that malnutrition refers to inappropriate nutrients or not the right balance of nutrients for optimal health.  Discussed with Pamela Hunt that it is absolutely possible to be malnourished but yet obese.  Risk  factors, including but not limited to, inappropriate dietary choices, difficulty with obtaining food due to physical or financial limitations, and various physical and mental health conditions were reviewed with Pamela Hunt.   12. Class 2 severe obesity with serious comorbidity and body mass index (BMI) of 39.0 to 39.9 in adult, unspecified obesity type (HCC)  Pamela Hunt is currently in the action stage of change and her goal is to continue with weight loss efforts. I recommend Pamela Hunt begin the structured treatment plan as follows:  She has agreed to the Category 1 Plan +300 calories.  Exercise goals: As is.  Started stretching recently.  Behavioral modification strategies: increasing lean protein intake, decreasing simple carbohydrates,  increasing vegetables, increasing water intake, decreasing liquid calories, decreasing alcohol intake, decreasing sodium intake and increasing high fiber foods.  She was informed of the importance of frequent follow-up visits to maximize her success with intensive lifestyle modifications for her multiple health conditions. She was informed we would discuss her lab results at her next visit unless there is a critical issue that needs to be addressed sooner. Pamela Hunt agreed to keep her next visit at the agreed upon time to discuss these results.  Objective:   Blood pressure 111/75, pulse 75, temperature 98 F (36.7 C), temperature source Oral, height 5\' 7"  (1.702 m), weight 254 lb (115.2 kg), last menstrual period 08/05/2020, SpO2 96 %, unknown if currently breastfeeding. Body mass index is 39.78 kg/m.  EKG: Normal sinus rhythm, rate 78 bpm.  Indirect Calorimeter completed today shows a VO2 of 227 and a REE of 1579.  Her calculated basal metabolic rate is 08/07/2020 thus her basal metabolic rate is worse than expected.  General: Cooperative, alert, well developed, in no acute distress. HEENT: Conjunctivae and lids unremarkable. Cardiovascular: Regular rhythm.  Lungs: Normal work of breathing. Neurologic: No focal deficits.   Lab Results  Component Value Date   CREATININE 1.00 09/09/2020   BUN 9 09/09/2020   NA 139 09/09/2020   K 4.4 09/09/2020   CL 106 09/09/2020   CO2 19 (L) 09/09/2020   Lab Results  Component Value Date   ALT 22 09/09/2020   AST 25 09/09/2020   ALKPHOS 101 09/09/2020   BILITOT 0.3 09/09/2020   Lab Results  Component Value Date   HGBA1C 5.7 (H) 09/09/2020   Lab Results  Component Value Date   INSULIN 5.4 09/09/2020   Lab Results  Component Value Date   TSH 0.818 09/09/2020   Lab Results  Component Value Date   CHOL 146 09/09/2020   HDL 55 09/09/2020   LDLCALC 81 09/09/2020   TRIG 43 09/09/2020   CHOLHDL 2.7 09/09/2020   Lab Results  Component Value  Date   WBC 4.9 09/09/2020   HGB 9.6 (L) 09/09/2020   HCT 30.7 (L) 09/09/2020   MCV 87 09/09/2020   PLT 265 09/09/2020   Lab Results  Component Value Date   IRON 45 09/09/2020   TIBC 383 09/09/2020   FERRITIN 13 (L) 09/09/2020   Attestation Statements:   This is the patient's first visit at [facility]. The patient's NEW PATIENT PACKET was reviewed at length. Included in the packet: current and past health history, medications, allergies, ROS, gynecologic history (women only), surgical history, family history, social history, weight history, weight loss surgery history (for those that have had weight loss surgery), nutritional evaluation, mood and food questionnaire, PHQ9, Epworth questionnaire, sleep habits questionnaire, patient life and health improvement goals questionnaire. These will all be scanned  into the patient's chart under media.   During the visit, I independently reviewed the patient's EKG, bioimpedance scale results, and indirect calorimeter results. I used this information to tailor a meal plan for the patient that will help her to lose weight and will improve her obesity-related conditions going forward. I performed a medically necessary appropriate examination and/or evaluation. I discussed the assessment and treatment plan with the patient. The patient was provided an opportunity to ask questions and all were answered. The patient agreed with the plan and demonstrated an understanding of the instructions. Labs were ordered at this visit and will be reviewed at the next visit unless more critical results need to be addressed immediately. Clinical information was updated and documented in the EMR.   I, Insurance claims handler, CMA, am acting as transcriptionist for Helane Rima, DO  I have reviewed the above documentation for accuracy and completeness, and I agree with the above. Helane Rima, DO

## 2020-09-30 ENCOUNTER — Ambulatory Visit (INDEPENDENT_AMBULATORY_CARE_PROVIDER_SITE_OTHER): Payer: 59 | Admitting: Family Medicine

## 2020-09-30 ENCOUNTER — Encounter (INDEPENDENT_AMBULATORY_CARE_PROVIDER_SITE_OTHER): Payer: Self-pay | Admitting: Family Medicine

## 2020-09-30 ENCOUNTER — Other Ambulatory Visit: Payer: Self-pay

## 2020-09-30 VITALS — BP 132/77 | HR 86 | Temp 98.3°F | Ht 67.0 in | Wt 253.0 lb

## 2020-09-30 DIAGNOSIS — R7303 Prediabetes: Secondary | ICD-10-CM | POA: Diagnosis not present

## 2020-09-30 DIAGNOSIS — Z9884 Bariatric surgery status: Secondary | ICD-10-CM

## 2020-09-30 DIAGNOSIS — D509 Iron deficiency anemia, unspecified: Secondary | ICD-10-CM | POA: Diagnosis not present

## 2020-09-30 DIAGNOSIS — M069 Rheumatoid arthritis, unspecified: Secondary | ICD-10-CM | POA: Diagnosis not present

## 2020-09-30 DIAGNOSIS — Z9189 Other specified personal risk factors, not elsewhere classified: Secondary | ICD-10-CM

## 2020-09-30 DIAGNOSIS — E559 Vitamin D deficiency, unspecified: Secondary | ICD-10-CM

## 2020-09-30 DIAGNOSIS — F5081 Binge eating disorder: Secondary | ICD-10-CM

## 2020-09-30 DIAGNOSIS — Z6839 Body mass index (BMI) 39.0-39.9, adult: Secondary | ICD-10-CM

## 2020-09-30 DIAGNOSIS — R632 Polyphagia: Secondary | ICD-10-CM
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
## 2020-10-01 ENCOUNTER — Telehealth (INDEPENDENT_AMBULATORY_CARE_PROVIDER_SITE_OTHER): Payer: Self-pay

## 2020-10-01 MED ORDER — VITAMIN D (ERGOCALCIFEROL) 1.25 MG (50000 UNIT) PO CAPS: 50000.0000 [IU] | ORAL_CAPSULE | ORAL | 0 refills | Status: DC

## 2020-10-01 MED ORDER — LISDEXAMFETAMINE DIMESYLATE 20 MG PO CAPS: 20.0000 mg | ORAL_CAPSULE | Freq: Every day | ORAL | 0 refills | Status: DC

## 2020-10-01 NOTE — Telephone Encounter (Signed)
Pt called in and stated that she had an appt yesterday and that her meds have not been sent to the pharmacy to pick-up. The pt is requesting them to be filled at [facility] on [address]. Please advise

## 2020-10-07 NOTE — Progress Notes (Signed)
Chief Complaint:   OBESITY Pamela Hunt is here to discuss her progress with her obesity treatment plan along with follow-up of her obesity related diagnoses.   Today's visit was #: 2 Starting weight: 254 lbs Starting date: 09/09/2020 Today's weight: 253 lbs Today's date: 09/30/2020 Weight change since last visit: 1 lb Total lbs lost to date: 1 lb Body mass index is 39.63 kg/m.  Total weight loss percentage to date: -0.39%  Interim History: Pamela Hunt says that her husband moved out.  She has been doing more eating out when on work trips.  She says she is recognizing when she is emotionally eating.  Current Meal Plan: the Category 1 Plan for 66% of the time.  Current Exercise Plan: Cardio/strength training for 45-60 minutes 3 times per week. Current Anti-Obesity Medications: phentermine 30 mg daily. Side effects: None.  Assessment/Plan:   Meds ordered this encounter  Medications   Vitamin D, Ergocalciferol, (DRISDOL) 1.25 MG (50000 UNIT) CAPS capsule    Sig: Take 1 capsule (50,000 Units total) by mouth every 7 (seven) days.    Dispense:  12 capsule    Refill:  0   lisdexamfetamine (VYVANSE) 20 MG capsule    Sig: Take 1 capsule (20 mg total) by mouth daily.    Dispense:  30 capsule    Refill:  0   1. Vitamin D deficiency Not at goal. Current vitamin D is 18.6, tested on 09/09/2020. Optimal goal > 50 ng/dL.   Plan: Start to take prescription Vitamin D @50 ,000 IU every week as prescribed.  Follow-up for routine testing of Vitamin D, at least 2-3 times per year to avoid over-replacement.  - Start Vitamin D, Ergocalciferol, (DRISDOL) 1.25 MG (50000 UNIT) CAPS capsule; Take 1 capsule (50,000 Units total) by mouth every 7 (seven) days.  Dispense: 12 capsule; Refill: 0  2. Iron deficiency anemia Pamela Hunt is not on an iron supplement.  Plan:  Recommend OTC slow release iron for 1-2 months and then recheck labs.  Nutrition: Iron-rich foods include dark leafy greens, red and white  meats, eggs, seafood, and beans.  Certain foods and drinks prevent your body from absorbing iron properly. Avoid eating these foods in the same meal as iron-rich foods or with iron supplements. These foods include: coffee, black tea, and red wine; milk, dairy products, and foods that are high in calcium; beans and soybeans; whole grains. Constipation can be a side effect of iron supplementation. Increased water and fiber intake are helpful. Water goal: > 2 liters/day. Fiber goal: > 25 grams/day.  3. Prediabetes Controlled. Goal is HgbA1c < 5.7.  Medication: None.    Plan:  Will consider GLP-1RA in the future.  She will continue to focus on protein-rich, low simple carbohydrate foods. We reviewed the importance of hydration, regular exercise for stress reduction, and restorative sleep.   Lab Results  Component Value Date   HGBA1C 5.7 (H) 09/09/2020   Lab Results  Component Value Date   INSULIN 5.4 09/09/2020   4. Rheumatoid arthritis of both wrists, unspecified whether rheumatoid factor present (HCC) Pamela Hunt has history of taking Humira.  She will start taking weekly prescription vitamin D due to low lab value.  5. Polyphagia Not at goal. Polyphagia refers to excessive feelings of hunger. She will continue to focus on protein-rich, low simple carbohydrate foods. We reviewed the importance of hydration, regular exercise for stress reduction, and restorative sleep.  6. History of gastric bypass Pamela Hunt is at risk for malnutrition due to her previous  bariatric surgery.   Counseling You may need to eat 3 meals and 2 snacks, or 5 small meals each day in order to reach your protein and calorie goals.  Allow at least 15 minutes for each meal so that you can eat mindfully. Listen to your body so that you do not overeat. For most people, your sleeve or pouch will comfortably hold 4-6 ounces. Eat foods from all food groups. This includes fruits and vegetables, grains, dairy, and meat and other  proteins. Include a protein-rich food at every meal and snack, and eat the protein food first.  You should be taking a Bariatric Multivitamin as well as calcium.   7. Binge eating disorder Pamela Hunt is currently taking phentermine 30 mg daily.  Plan:  Stop phentermine and start Vyvanse 20 mg daily.   Patient was referred to Dr. Dewaine Conger, our Bariatric Psychologist, for evaluation due to her elevated PHQ-9 score and significant struggles with emotional eating.   People who binge eat feel as if they don't have control over how much they eat and have feelings of guilt or self-loathing after a binge eating episode. [employer] estimates that about 30 percent of adults with binge eating disorder also have a history of ADHD. The FDA has approved Vyvanse as a treatment option for both ADHD and binge eating. Vyvanse targets the brain's reward center by increasing the levels of dopamine and norepinephrine, the chemicals of the brain responsible for feelings of pleasure. Mindful eating is the recommended nutritional approach to treating BED.   - Start lisdexamfetamine (VYVANSE) 20 MG capsule; Take 1 capsule (20 mg total) by mouth daily.  Dispense: 30 capsule; Refill: 0  I have consulted the [place] Controlled Substances Registry for this patient, and feel the risk/benefit ratio today is favorable for proceeding with this prescription for a controlled substance. The patient understands monitoring parameters and red flags.   8. At risk for malnutrition Pamela Hunt was given extensive malnutrition prevention education and counseling today of more than 8 minutes.  Counseled her that malnutrition refers to inappropriate nutrients or not the right balance of nutrients for optimal health.  Discussed with Pamela Hunt that it is absolutely possible to be malnourished but yet obese.     9. Obesity, current BMI 39.6  Course: Pamela Hunt is currently in the action stage of change. As such, her goal is to continue with weight  loss efforts.   Nutrition goals: She has agreed to the Category 1 Plan +300 calories or practicing portion control and making smarter food choices, such as increasing vegetables and decreasing simple carbohydrates when traveling.  Exercise goals:  As is.  Behavioral modification strategies: increasing lean protein intake, decreasing simple carbohydrates, increasing vegetables, increasing water intake, and decreasing liquid calories.  Pamela Hunt has agreed to follow-up with our clinic in 3 weeks. She was informed of the importance of frequent follow-up visits to maximize her success with intensive lifestyle modifications for her multiple health conditions.   Objective:   Blood pressure 132/77, pulse 86, temperature 98.3 F (36.8 C), temperature source Oral, height 5\' 7"  (1.702 m), weight 253 lb (114.8 kg), last menstrual period 09/29/2020, SpO2 97 %, unknown if currently breastfeeding. Body mass index is 39.63 kg/m.  General: Cooperative, alert, well developed, in no acute distress. HEENT: Conjunctivae and lids unremarkable. Cardiovascular: Regular rhythm.  Lungs: Normal work of breathing. Neurologic: No focal deficits.   Lab Results  Component Value Date   CREATININE 1.00 09/09/2020   BUN 9 09/09/2020   NA 139  09/09/2020   K 4.4 09/09/2020   CL 106 09/09/2020   CO2 19 (L) 09/09/2020   Lab Results  Component Value Date   ALT 22 09/09/2020   AST 25 09/09/2020   ALKPHOS 101 09/09/2020   BILITOT 0.3 09/09/2020   Lab Results  Component Value Date   HGBA1C 5.7 (H) 09/09/2020   Lab Results  Component Value Date   INSULIN 5.4 09/09/2020   Lab Results  Component Value Date   TSH 0.818 09/09/2020   Lab Results  Component Value Date   CHOL 146 09/09/2020   HDL 55 09/09/2020   LDLCALC 81 09/09/2020   TRIG 43 09/09/2020   CHOLHDL 2.7 09/09/2020   Lab Results  Component Value Date   WBC 4.9 09/09/2020   HGB 9.6 (L) 09/09/2020   HCT 30.7 (L) 09/09/2020   MCV 87  09/09/2020   PLT 265 09/09/2020   Lab Results  Component Value Date   IRON 45 09/09/2020   TIBC 383 09/09/2020   FERRITIN 13 (L) 09/09/2020   Attestation Statements:   Reviewed by clinician on day of visit: allergies, medications, problem list, medical history, surgical history, family history, social history, and previous encounter notes.  I, Insurance claims handler, CMA, am acting as transcriptionist for Helane Rima, DO  I have reviewed the above documentation for accuracy and completeness, and I agree with the above. Helane Rima, DO

## 2020-10-14 ENCOUNTER — Telehealth (INDEPENDENT_AMBULATORY_CARE_PROVIDER_SITE_OTHER): Payer: 59 | Admitting: Psychology

## 2020-10-14 DIAGNOSIS — F4323 Adjustment disorder with mixed anxiety and depressed mood: Secondary | ICD-10-CM

## 2020-10-14 DIAGNOSIS — F5089 Other specified eating disorder: Secondary | ICD-10-CM

## 2020-10-21 ENCOUNTER — Other Ambulatory Visit: Payer: Self-pay

## 2020-10-21 ENCOUNTER — Encounter (INDEPENDENT_AMBULATORY_CARE_PROVIDER_SITE_OTHER): Payer: Self-pay | Admitting: Family Medicine

## 2020-10-21 ENCOUNTER — Ambulatory Visit (INDEPENDENT_AMBULATORY_CARE_PROVIDER_SITE_OTHER): Payer: 59 | Admitting: Family Medicine

## 2020-10-21 VITALS — BP 119/77 | HR 76 | Temp 98.1°F | Ht 67.0 in | Wt 244.0 lb

## 2020-10-21 DIAGNOSIS — Z9189 Other specified personal risk factors, not elsewhere classified: Secondary | ICD-10-CM

## 2020-10-21 DIAGNOSIS — F5081 Binge eating disorder: Secondary | ICD-10-CM

## 2020-10-21 DIAGNOSIS — Z6839 Body mass index (BMI) 39.0-39.9, adult: Secondary | ICD-10-CM

## 2020-10-21 DIAGNOSIS — F50819 Binge eating disorder, unspecified: Secondary | ICD-10-CM

## 2020-10-21 DIAGNOSIS — E66812 Obesity, class 2: Secondary | ICD-10-CM

## 2020-10-21 DIAGNOSIS — E559 Vitamin D deficiency, unspecified: Secondary | ICD-10-CM

## 2020-10-21 DIAGNOSIS — R7303 Prediabetes: Secondary | ICD-10-CM | POA: Diagnosis not present

## 2020-10-21 MED ORDER — LISDEXAMFETAMINE DIMESYLATE 20 MG PO CAPS: 20.0000 mg | ORAL_CAPSULE | Freq: Every day | ORAL | 0 refills | Status: DC

## 2020-10-29 NOTE — Progress Notes (Signed)
Chief Complaint:   OBESITY Pamela Hunt is here to discuss her progress with her obesity treatment plan along with follow-up of her obesity related diagnoses.   Today's visit was #: 3 Starting weight: 254 lbs Starting date: 09/09/2020 Today's weight: 244 lbs Today's date: 10/21/2020 Weight change since last visit: 9 lbs Total lbs lost to date: 10 lbs Body mass index is 38.22 kg/m.  Total weight loss percentage to date: -3.94%  Interim History:  [name] saw Dr. Dewaine Conger and says it was very helpful.  She reports being diagnosed with ADHD in her 30s.  She says she is settled into her new house and is much happier.  She says that her sons seem happier as well.  Current Meal Plan: practicing portion control and making smarter food choices, such as increasing vegetables and decreasing simple carbohydrates for 80-85% of the time.  Current Exercise Plan: Strength training and cardio for 60 minutes 3 times per week.  Assessment/Plan:   Meds ordered this encounter  Medications   lisdexamfetamine (VYVANSE) 20 MG capsule    Sig: Take 1 capsule (20 mg total) by mouth daily.    Dispense:  30 capsule    Refill:  0   1. Prediabetes At goal. Goal is HgbA1c < 5.7.  Medication: None.    Plan:  She will continue to focus on protein-rich, low simple carbohydrate foods. We reviewed the importance of hydration, regular exercise for stress reduction, and restorative sleep.   Lab Results  Component Value Date   HGBA1C 5.7 (H) 09/09/2020   Lab Results  Component Value Date   INSULIN 5.4 09/09/2020   2. Vitamin D deficiency Not at goal.  She is taking vitamin D 50,000 IU weekly.  Plan: Continue to take prescription Vitamin D @50 ,000 IU every week as prescribed.  Follow-up for routine testing of Vitamin D, at least 2-3 times per year to avoid over-replacement.  Lab Results  Component Value Date   VD25OH 18.6 (L) 09/09/2020   3. Binge eating disorder Pamela Hunt is taking Vyvanse 20 mg daily.   Will refill today.  People who binge eat feel as if they don't have control over how much they eat and have feelings of guilt or self-loathing after a binge eating episode. [employer] estimates that about 30 percent of adults with binge eating disorder also have a history of ADHD. The FDA has approved Vyvanse as a treatment option for both ADHD and binge eating. Vyvanse targets the brain's reward center by increasing the levels of dopamine and norepinephrine, the chemicals of the brain responsible for feelings of pleasure. Mindful eating is the recommended nutritional approach to treating BED.   I have consulted the  Controlled Substances Registry for this patient, and feel the risk/benefit ratio today is favorable for proceeding with this prescription for a controlled substance. The patient understands monitoring parameters and red flags.   - Refill lisdexamfetamine (VYVANSE) 20 MG capsule; Take 1 capsule (20 mg total) by mouth daily.  Dispense: 30 capsule; Refill: 0  4. At risk for impaired metabolic function Due to [name]'s current state of health and medical condition(s), she is at a significantly higher risk for impaired metabolic function.   At least 8 minutes was spent on counseling Pamela Hunt about these concerns today.  This places the patient at a much greater risk to subsequently develop cardio-pulmonary conditions that can negatively affect the patient's quality of life.  I stressed the importance of reversing these risks factors.  The initial goal  is to lose at least 5-10% of starting weight to help reduce risk factors.    5. Obesity, current BMI 38.2  Course: [name] is currently in the action stage of change. As such, her goal is to continue with weight loss efforts.   Nutrition goals: She has agreed to practicing portion control and making smarter food choices, such as increasing vegetables and decreasing simple carbohydrates.   Exercise goals:  As is.  Behavioral modification  strategies: increasing lean protein intake, decreasing simple carbohydrates, increasing vegetables, and increasing water intake.  [name] has agreed to follow-up with our clinic in 3 weeks. She was informed of the importance of frequent follow-up visits to maximize her success with intensive lifestyle modifications for her multiple health conditions.   Objective:   Blood pressure 119/77, pulse 76, temperature 98.1 F (36.7 C), temperature source Oral, height 5\' 7"  (1.702 m), weight 244 lb (110.7 kg), last menstrual period 09/29/2020, SpO2 99 %, unknown if currently breastfeeding. Body mass index is 38.22 kg/m.  General: Cooperative, alert, well developed, in no acute distress. HEENT: Conjunctivae and lids unremarkable. Cardiovascular: Regular rhythm.  Lungs: Normal work of breathing. Neurologic: No focal deficits.   Lab Results  Component Value Date   CREATININE 1.00 09/09/2020   BUN 9 09/09/2020   NA 139 09/09/2020   K 4.4 09/09/2020   CL 106 09/09/2020   CO2 19 (L) 09/09/2020   Lab Results  Component Value Date   ALT 22 09/09/2020   AST 25 09/09/2020   ALKPHOS 101 09/09/2020   BILITOT 0.3 09/09/2020   Lab Results  Component Value Date   HGBA1C 5.7 (H) 09/09/2020   Lab Results  Component Value Date   INSULIN 5.4 09/09/2020   Lab Results  Component Value Date   TSH 0.818 09/09/2020   Lab Results  Component Value Date   CHOL 146 09/09/2020   HDL 55 09/09/2020   LDLCALC 81 09/09/2020   TRIG 43 09/09/2020   CHOLHDL 2.7 09/09/2020   Lab Results  Component Value Date   VD25OH 18.6 (L) 09/09/2020   Lab Results  Component Value Date   WBC 4.9 09/09/2020   HGB 9.6 (L) 09/09/2020   HCT 30.7 (L) 09/09/2020   MCV 87 09/09/2020   PLT 265 09/09/2020   Lab Results  Component Value Date   IRON 45 09/09/2020   TIBC 383 09/09/2020   FERRITIN 13 (L) 09/09/2020   Attestation Statements:   Reviewed by clinician on day of visit: allergies, medications, problem  list, medical history, surgical history, family history, social history, and previous encounter notes.  I, 09/11/2020, CMA, am acting as transcriptionist for Insurance claims handler, DO  I have reviewed the above documentation for accuracy and completeness, and I agree with the above. Helane Rima, DO

## 2020-11-19 ENCOUNTER — Telehealth (INDEPENDENT_AMBULATORY_CARE_PROVIDER_SITE_OTHER): Payer: 59 | Admitting: Family Medicine

## 2020-11-19 ENCOUNTER — Other Ambulatory Visit: Payer: Self-pay

## 2020-11-19 ENCOUNTER — Encounter (INDEPENDENT_AMBULATORY_CARE_PROVIDER_SITE_OTHER): Payer: Self-pay | Admitting: Family Medicine

## 2020-11-19 DIAGNOSIS — E559 Vitamin D deficiency, unspecified: Secondary | ICD-10-CM

## 2020-11-19 DIAGNOSIS — R7303 Prediabetes: Secondary | ICD-10-CM | POA: Diagnosis not present

## 2020-11-19 DIAGNOSIS — F908 Attention-deficit hyperactivity disorder, other type: Secondary | ICD-10-CM

## 2020-11-19 DIAGNOSIS — Z6839 Body mass index (BMI) 39.0-39.9, adult: Secondary | ICD-10-CM

## 2020-11-19 MED ORDER — LISDEXAMFETAMINE DIMESYLATE 20 MG PO CAPS: 20.0000 mg | ORAL_CAPSULE | Freq: Every day | ORAL | 0 refills | Status: DC

## 2020-11-24 NOTE — Progress Notes (Signed)
TeleHealth Visit:  Due to the COVID-19 pandemic, this visit was completed with telemedicine (audio/video) technology to reduce patient and provider exposure as well as to preserve personal protective equipment.   [name] has verbally consented to this TeleHealth visit. The patient is located at home, the provider is located at the [facility] office. The participants in this visit include the listed provider and patient. The visit was conducted today via MyChart.  Chief Complaint: OBESITY Pamela Hunt is here to discuss her progress with her obesity treatment plan along with follow-up of her obesity related diagnoses. [name] is on practicing portion control and making smarter food choices, such as increasing vegetables and decreasing simple carbohydrates and states she is following her eating plan approximately 80% of the time. [name] states she is doing strength training and cardio for 60 minutes 3 times per week.  Today's visit was #: 4 Starting weight: 254 lbs Starting date: 09/09/2020  Interim History: Pamela Hunt says that her home weight is 237.5 (our last visit: 244).  She reports that Vyvanse is helpful for ADHD, impulsive eating, and appetite control.  She is getting over COVID and says she has no lingering issues.  Assessment/Plan:   1. Prediabetes At goal. Goal is HgbA1c < 5.7.  Medication: None.    Plan:  She will continue to focus on protein-rich, low simple carbohydrate foods. We reviewed the importance of hydration, regular exercise for stress reduction, and restorative sleep.   Lab Results  Component Value Date   HGBA1C 5.7 (H) 09/09/2020   Lab Results  Component Value Date   INSULIN 5.4 09/09/2020   2. Vitamin D deficiency Not at goal.  She is taking vitamin D 50,000 IU weekly.  Plan: Continue to take prescription Vitamin D @50 ,000 IU every week as prescribed.  Follow-up for routine testing of Vitamin D, at least 2-3 times per year to avoid  over-replacement.  Lab Results  Component Value Date   VD25OH 18.6 (L) 09/09/2020   3. Attention deficit hyperactivity disorder (ADHD), other type, with BED Improving. Pamela Hunt is taking Vyvanse 20 mg daily for BED and ADHD.  Plan:  The current medical regimen is effective;  continue present plan and medications.  I have consulted the [place] Controlled Substances Registry for this patient, and feel the risk/benefit ratio today is favorable for proceeding with this prescription for a controlled substance. The patient understands monitoring parameters and red flags.   - Refill lisdexamfetamine (VYVANSE) 20 MG capsule; Take 1 capsule (20 mg total) by mouth daily.  Dispense: 30 capsule; Refill: 0  4. Obesity, current BMI 38.2  Pamela Hunt is currently in the action stage of change. As such, her goal is to continue with weight loss efforts. She has agreed to practicing portion control and making smarter food choices, such as increasing vegetables and decreasing simple carbohydrates. Okay use of protein shakes.  Exercise goals:  As is.  Behavioral modification strategies: increasing lean protein intake, decreasing simple carbohydrates, increasing vegetables, and increasing water intake.  Pamela Hunt has agreed to follow-up with our clinic in 3-4 weeks. She was informed of the importance of frequent follow-up visits to maximize her success with intensive lifestyle modifications for her multiple health conditions.  Objective:   VITALS: Per patient if applicable, see vitals. GENERAL: Alert and in no acute distress. CARDIOPULMONARY: No increased WOB. Speaking in clear sentences.  PSYCH: Pleasant and cooperative. Speech normal rate and rhythm. Affect is appropriate. Insight and judgement are appropriate. Attention is focused, linear, and appropriate.  NEURO: Oriented as arrived to appointment on time with no prompting.   Lab Results  Component Value Date   CREATININE 1.00 09/09/2020   BUN 9 09/09/2020    NA 139 09/09/2020   K 4.4 09/09/2020   CL 106 09/09/2020   CO2 19 (L) 09/09/2020   Lab Results  Component Value Date   ALT 22 09/09/2020   AST 25 09/09/2020   ALKPHOS 101 09/09/2020   BILITOT 0.3 09/09/2020   Lab Results  Component Value Date   HGBA1C 5.7 (H) 09/09/2020   Lab Results  Component Value Date   INSULIN 5.4 09/09/2020   Lab Results  Component Value Date   TSH 0.818 09/09/2020   Lab Results  Component Value Date   CHOL 146 09/09/2020   HDL 55 09/09/2020   LDLCALC 81 09/09/2020   TRIG 43 09/09/2020   CHOLHDL 2.7 09/09/2020   Lab Results  Component Value Date   VD25OH 18.6 (L) 09/09/2020   Lab Results  Component Value Date   WBC 4.9 09/09/2020   HGB 9.6 (L) 09/09/2020   HCT 30.7 (L) 09/09/2020   MCV 87 09/09/2020   PLT 265 09/09/2020   Lab Results  Component Value Date   IRON 45 09/09/2020   TIBC 383 09/09/2020   FERRITIN 13 (L) 09/09/2020   Attestation Statements:   Reviewed by clinician on day of visit: allergies, medications, problem list, medical history, surgical history, family history, social history, and previous encounter notes.  Time spent on visit including pre-visit chart review and post-visit charting and care was 23 minutes.   I, Insurance claims handler, CMA, am acting as transcriptionist for Helane Rima, DO  I have reviewed the above documentation for accuracy and completeness, and I agree with the above. Helane Rima, DO

## 2020-12-05 ENCOUNTER — Ambulatory Visit
Admission: EM | Admit: 2020-12-05 | Discharge: 2020-12-05 | Disposition: A | Discharge status: Home / Self Care | Payer: 59 | Attending: Urgent Care | Admitting: Urgent Care

## 2020-12-05 ENCOUNTER — Other Ambulatory Visit: Payer: Self-pay

## 2020-12-05 DIAGNOSIS — H18822 Corneal disorder due to contact lens, left eye: Secondary | ICD-10-CM | POA: Diagnosis not present

## 2020-12-05 DIAGNOSIS — H5712 Ocular pain, left eye: Secondary | ICD-10-CM

## 2020-12-05 MED ORDER — TOBRAMYCIN 0.3 % OP SOLN: 2.0000 [drp] | OPHTHALMIC | 0 refills | Status: DC

## 2020-12-05 NOTE — ED Triage Notes (Signed)
Pt reports that she scratched her left cornea this morning while trying to put in her contact. No decreases in vision. No meds or eyedrops used.

## 2020-12-05 NOTE — ED Provider Notes (Signed)
[facility]   MRN: [id number] DOB: January 07, 1976  Subjective:   Pamela Hunt is a 45 y.o. female presenting for acute onset this morning of left thigh pain.  Patient states that she feels like she scratched her eye when she was trying to put in her contact lenses.  She ended up switching to eyeglasses but her eye has continued to bother her throughout the day.  No changes in her vision, eye swelling, eye drainage.  Has not tried any medications for relief.  No current facility-administered medications for this encounter.  Current Outpatient Medications:    ALPRAZolam (XANAX) 0.5 MG tablet, Take 0.5 mg by mouth at bedtime., Disp: , Rfl:    amitriptyline (ELAVIL) 25 MG tablet, Take 50 mg by mouth at bedtime., Disp: , Rfl:    buPROPion (WELLBUTRIN XL) 300 MG 24 hr tablet, Take 1 tablet by mouth daily., Disp: , Rfl:    Butalbital-APAP-Caffeine 50-325-40 MG capsule, Take 1 capsule by mouth every 4 (four) hours as needed for pain. 50/40/325 mg, Disp: , Rfl:    lisdexamfetamine (VYVANSE) 20 MG capsule, Take 1 capsule (20 mg total) by mouth daily., Disp: 30 capsule, Rfl: 0   Vitamin D, Ergocalciferol, (DRISDOL) 1.25 MG (50000 UNIT) CAPS capsule, Take 1 capsule (50,000 Units total) by mouth every 7 (seven) days., Disp: 12 capsule, Rfl: 0   Allergies  Allergen Reactions   Tetracyclines & Related Anaphylaxis   Tomato Itching    Past Medical History:  Diagnosis Date   Abnormal Pap smear    Anxiety    Arthritis    ra   Asthma    no inhaler use in many years   Headache(784.0)    migraines   History of pregnancy induced hypertension 6 yrs ago   Migraines    Pneumonia    Had as a child   Rheumatoid arthritis (HCC)    Wears glasses      Past Surgical History:  Procedure Laterality Date   GASTRIC BYPASS  2016   LAPAROSCOPIC BILATERAL SALPINGECTOMY Bilateral 06/23/2020   Procedure: LAPAROSCOPIC BILATERAL SALPINGECTOMY;  Surgeon: Jaymes Graff, MD;  Location: [facility];  Service: Gynecology;  Laterality: Bilateral;   WISDOM TOOTH EXTRACTION  yrs ago    Family History  Problem Relation Age of Onset   Thyroid nodules Mother    Obesity Mother    Thyroid nodules Father    Thyroid nodules Maternal Grandmother    Cancer Paternal Grandmother    Heart disease Paternal Grandfather    Arthritis Brother     Social History   Tobacco Use   Smoking status: Never   Smokeless tobacco: Never  Vaping Use   Vaping Use: Never used  Substance Use Topics   Alcohol use: Yes    Comment: occ   Drug use: Not Currently    Types: Marijuana    Comment: marijuana in past none recent 06-18-2020    ROS   Objective:   Vitals: There were no vitals taken for this visit.  Physical Exam Constitutional:      General: She is not in acute distress.    Appearance: Normal appearance. She is well-developed. She is not ill-appearing, toxic-appearing or diaphoretic.  HENT:     Head: Normocephalic and atraumatic.     Nose: Nose normal.     Mouth/Throat:     Mouth: Mucous membranes are moist.     Pharynx: Oropharynx is clear.  Eyes:     General: Lids are everted, no foreign  bodies appreciated. Vision grossly intact. No scleral icterus.       Right eye: No foreign body, discharge or hordeolum.        Left eye: No foreign body, discharge or hordeolum.     Extraocular Movements: Extraocular movements intact.     Right eye: Normal extraocular motion and no nystagmus.     Left eye: Normal extraocular motion and no nystagmus.     Conjunctiva/sclera:     Right eye: Right conjunctiva is not injected. No chemosis, exudate or hemorrhage.    Left eye: Left conjunctiva is injected. No chemosis, exudate or hemorrhage.    Pupils: Pupils are equal, round, and reactive to light.   Cardiovascular:     Rate and Rhythm: Normal rate.  Pulmonary:     Effort: Pulmonary effort is normal.  Skin:    General: Skin is warm and dry.  Neurological:     General: No focal  deficit present.     Mental Status: She is alert and oriented to person, place, and time.  Psychiatric:        Mood and Affect: Mood normal.        Behavior: Behavior normal.        Thought Content: Thought content normal.        Judgment: Judgment normal.    Eye Exam: Eyelids everted and swept for foreign body. The eye was anesthetized with 2 drops of tetracaine and stained with fluorescein. Examination under woods lamp does reveal an area of increased stain uptake as outlined on the eye exam. The eye was then irrigated copiously with saline.   Assessment and Plan :   PDMP not reviewed this encounter.  1. Corneal abrasion due to contact lens, left   2. Left eye pain     Recommended starting tobramycin as she is a contact lens wearer.  Follow-up as soon as possible with her ophthalmologist. Counseled patient on potential for adverse effects with medications prescribed/recommended today, ER and return-to-clinic precautions discussed, patient verbalized understanding.    Wallis Bamberg, PA-C 12/05/20 1735

## 2020-12-10 ENCOUNTER — Ambulatory Visit (INDEPENDENT_AMBULATORY_CARE_PROVIDER_SITE_OTHER): Payer: 59 | Admitting: Family Medicine

## 2020-12-12 IMAGING — US US FNA BIOPSY THYROID 1ST LESION
1 series · 13 of 20 positions shown · non-contrast
Comparison: US thyroid, 03/28/2019

MEDICATIONS:
1% lidocaine, 3 ml

COMPLICATIONS:
None immediate.

INDICATION: Indeterminate thyroid nodule, right mid thyroid

EXAM:
ULTRASOUND GUIDED FINE NEEDLE ASPIRATION OF INDETERMINATE THYROID
NODULE
TECHNIQUE: Informed written consent was obtained from the patient after a
discussion of the risks, benefits and alternatives to treatment.
Questions regarding the procedure were encouraged and answered. A
timeout was performed prior to the initiation of the procedure.

[Series 1: us fna biopsy thyroid 1st lesion · 0.06mm/px · 20 acquisitions, 13 frames shown]
[im 1/20]
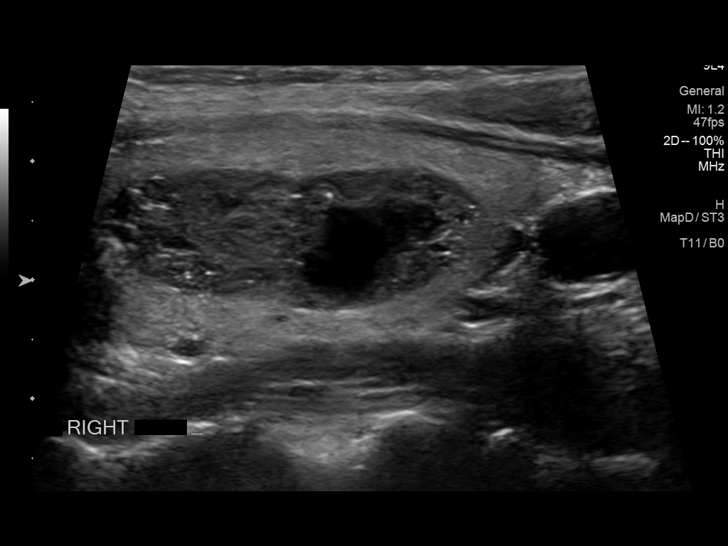
[im 3/20]
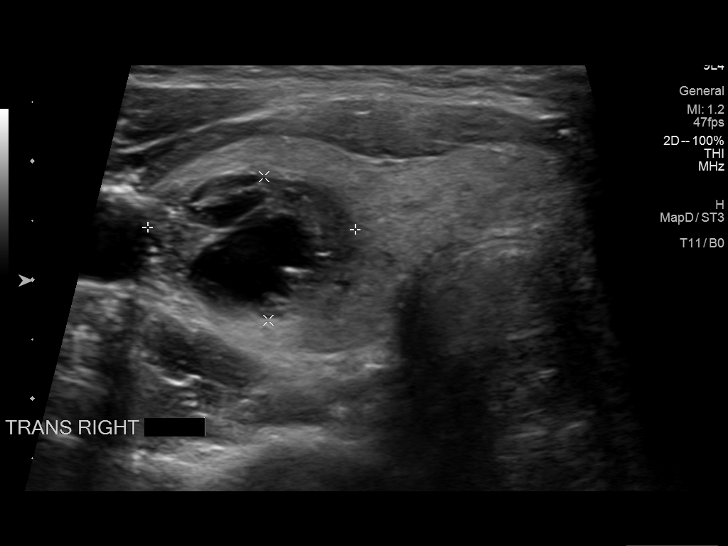
[im 4/20]
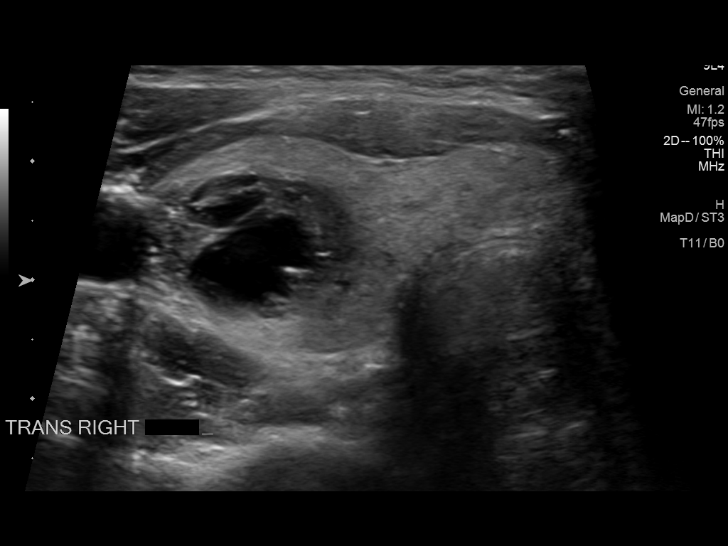
[im 6/20]
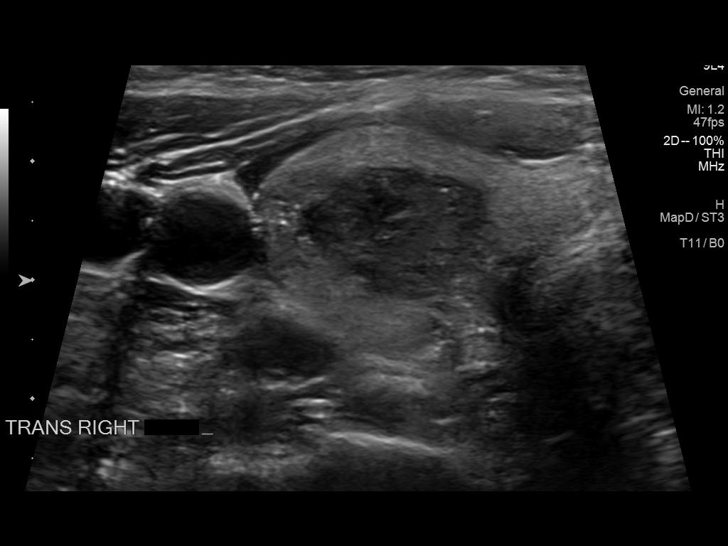
[im 7/20]
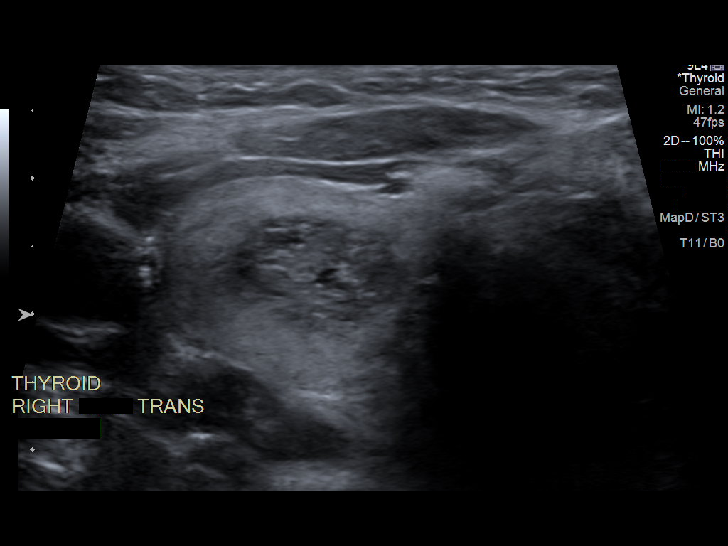
[im 9/20]
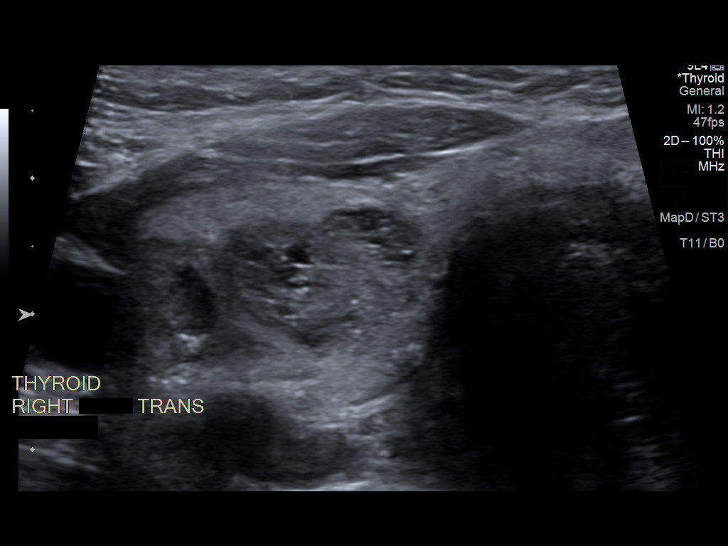
[im 11/20]
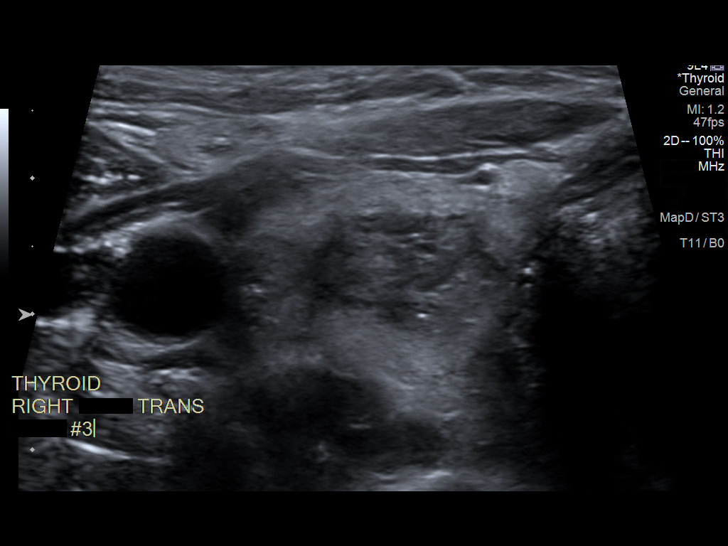
[im 12/20]
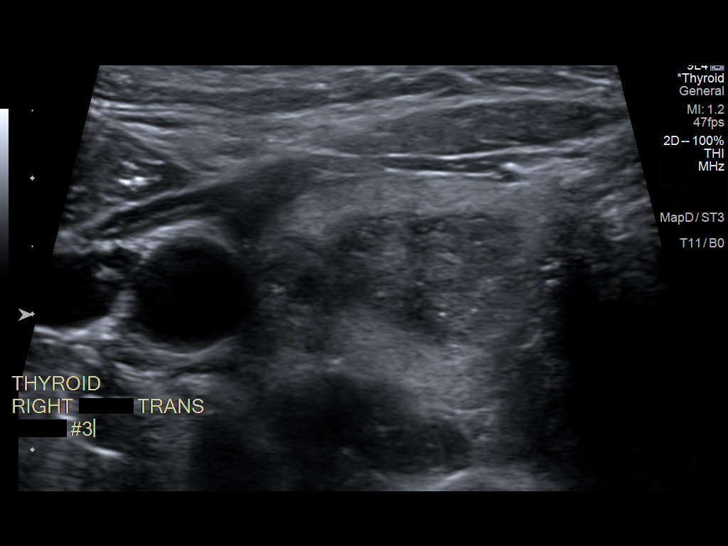
[im 14/20]
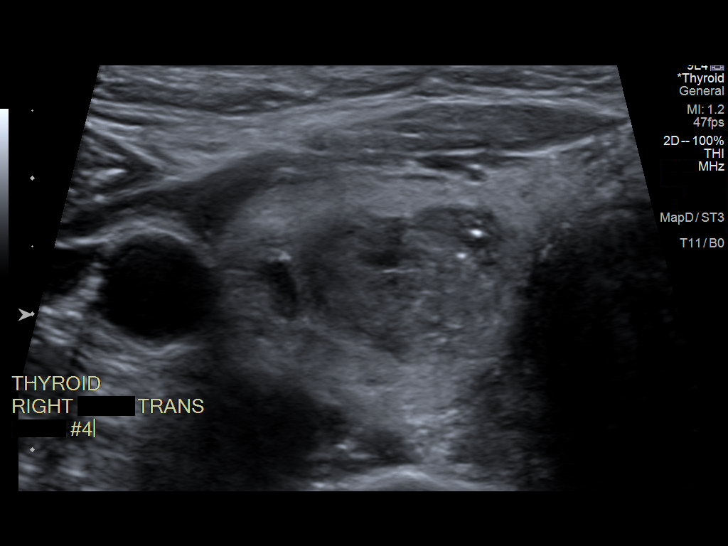
[im 15/20]
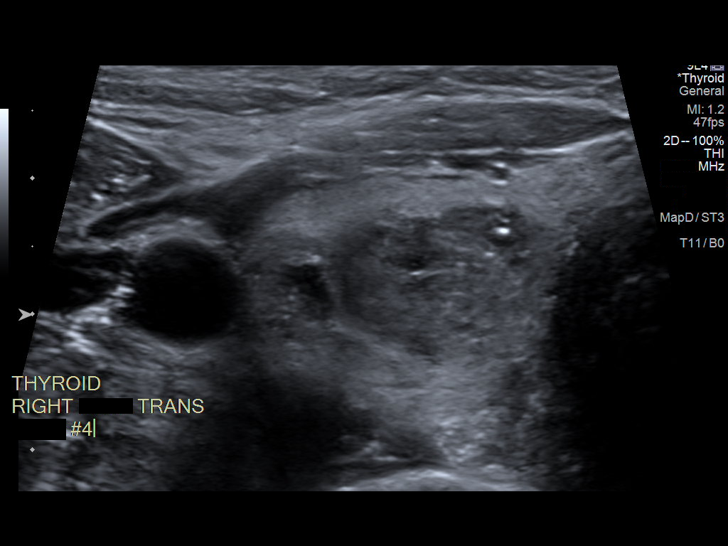
[im 17/20]
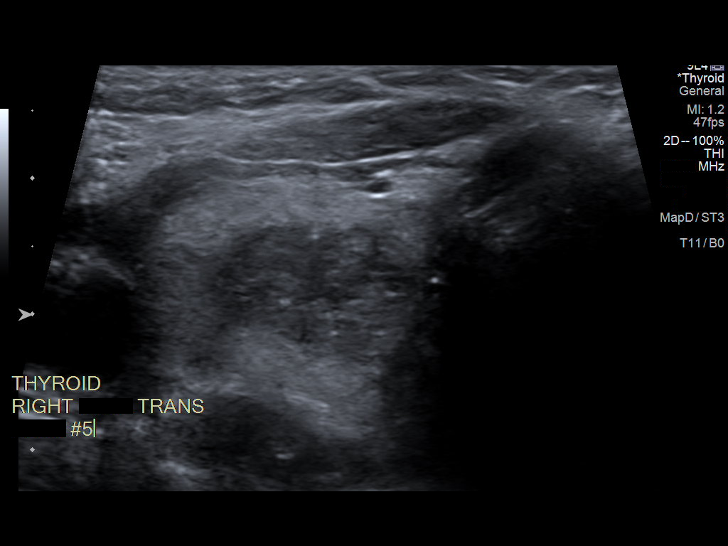
[im 18/20]
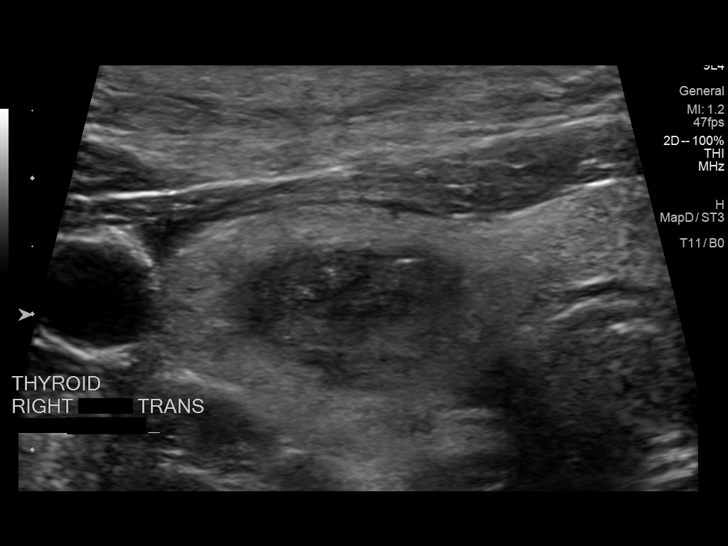
[im 20/20]
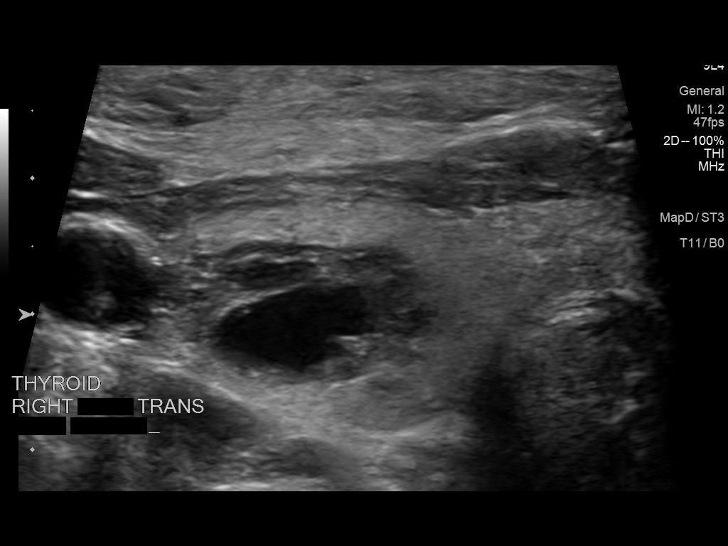

[13 of 20 positions shown; findings below may reference images not displayed]

Pre-procedural ultrasound scanning demonstrated unchanged size and
appearance of the indeterminate nodule within the right mid thyroid

The procedure was planned. The neck was prepped in the usual sterile
fashion, and a sterile drape was applied covering the operative
field. A timeout was performed prior to the initiation of the
procedure. Local anesthesia was provided with 1% lidocaine.

Under direct ultrasound guidance, 5 FNA biopsies were performed of
the right mid thyroid nodule with a 25 gauge needle. Multiple
ultrasound images were saved for procedural documentation purposes.
The samples were prepared and submitted to pathology. 2 of these
samples were prepared for Afirma testing.

Limited post procedural scanning was negative for hematoma or
additional complication. Dressings were placed. The patient
tolerated the above procedures procedure well without immediate
postprocedural complication.
FINDINGS: Nodule reference number based on prior diagnostic ultrasound: 1

Maximum size: 3.2 cm

Location: Right; Mid

ACR TI-RADS risk category: TR4 (4-6 points)

Reason for biopsy: meets ACR TI-RADS criteria

Ultrasound imaging confirms appropriate placement of the needles
within the thyroid nodule.
IMPRESSION: Technically successful ultrasound guided fine needle aspiration of
right mid thyroid nodule. Read by: Papo Tete Migdalia, NP

## 2020-12-13 ENCOUNTER — Other Ambulatory Visit (INDEPENDENT_AMBULATORY_CARE_PROVIDER_SITE_OTHER): Payer: Self-pay | Admitting: Family Medicine

## 2020-12-13 DIAGNOSIS — E559 Vitamin D deficiency, unspecified: Secondary | ICD-10-CM

## 2020-12-15 NOTE — Telephone Encounter (Signed)
Dr.Wallace °

## 2020-12-31 ENCOUNTER — Ambulatory Visit (INDEPENDENT_AMBULATORY_CARE_PROVIDER_SITE_OTHER): Payer: 59 | Admitting: Family Medicine

## 2020-12-31 ENCOUNTER — Other Ambulatory Visit: Payer: Self-pay

## 2020-12-31 ENCOUNTER — Encounter (INDEPENDENT_AMBULATORY_CARE_PROVIDER_SITE_OTHER): Payer: Self-pay | Admitting: Family Medicine

## 2020-12-31 VITALS — BP 108/69 | HR 79 | Temp 98.0°F | Ht 67.0 in | Wt 236.0 lb

## 2020-12-31 DIAGNOSIS — Z9189 Other specified personal risk factors, not elsewhere classified: Secondary | ICD-10-CM | POA: Diagnosis not present

## 2020-12-31 DIAGNOSIS — R7303 Prediabetes: Secondary | ICD-10-CM | POA: Diagnosis not present

## 2020-12-31 DIAGNOSIS — F908 Attention-deficit hyperactivity disorder, other type: Secondary | ICD-10-CM

## 2020-12-31 DIAGNOSIS — E559 Vitamin D deficiency, unspecified: Secondary | ICD-10-CM

## 2020-12-31 DIAGNOSIS — Z6839 Body mass index (BMI) 39.0-39.9, adult: Secondary | ICD-10-CM

## 2020-12-31 MED ORDER — VYVANSE 30 MG PO CHEW: 30.0000 mg | CHEWABLE_TABLET | Freq: Every day | ORAL | 0 refills | Status: DC

## 2020-12-31 MED ORDER — VITAMIN D (ERGOCALCIFEROL) 1.25 MG (50000 UNIT) PO CAPS: 50000.0000 [IU] | ORAL_CAPSULE | ORAL | 0 refills | Status: DC

## 2020-12-31 NOTE — Progress Notes (Signed)
Chief Complaint:   OBESITY Pamela Hunt is here to discuss her progress with her obesity treatment plan along with follow-up of her obesity related diagnoses.   Today's visit was #: 5 Starting weight: 254 lbs Starting date: 09/09/2020 Today's weight: 236 lbs Today's date: 12/31/2020 Weight change since last visit: 8 lbs Total lbs lost to date: 18 lbs Body mass index is 36.96 kg/m.  Total weight loss percentage to date: -7.09%  Current Meal Plan: practicing portion control and making smarter food choices, such as increasing vegetables and decreasing simple carbohydrates for 50% of the time.  Current Exercise Plan: Strength training and conditioning for 60 minutes 3 times per week.  Interim History:  Pamela Hunt says that night eating has become an issue.  She says that co-parenting is going well, but her husband wants to reconcile and she does not.  Assessment/Plan:   1. Prediabetes At goal. Goal is HgbA1c < 5.7.  Medication: None.    Plan:  Consider Mounjaro.  She will continue to focus on protein-rich, low simple carbohydrate foods. We reviewed the importance of hydration, regular exercise for stress reduction, and restorative sleep.   Lab Results  Component Value Date   HGBA1C 5.7 (H) 09/09/2020   Lab Results  Component Value Date   INSULIN 5.4 09/09/2020   2. Vitamin D deficiency Not at goal.  She is taking vitamin D 50,000 IU weekly.  Plan: Continue to take prescription Vitamin D @50 ,000 IU every week as prescribed.  Follow-up for routine testing of Vitamin D, at least 2-3 times per year to avoid over-replacement.  Lab Results  Component Value Date   VD25OH 18.6 (L) 09/09/2020   - Refill Vitamin D, Ergocalciferol, (DRISDOL) 1.25 MG (50000 UNIT) CAPS capsule; Take 1 capsule (50,000 Units total) by mouth every 7 (seven) days.  Dispense: 12 capsule; Refill: 0  3. Attention deficit hyperactivity disorder (ADHD), other type, with BED [name] is taking Vyvanse 20 mg daily  for BED and ADHD.  She says that night eating has become an issue as well.  Plan:  Increase Vyvanse to 30 mg daily, as per below.   - Increase Lisdexamfetamine Dimesylate (VYVANSE) 30 MG CHEW; Chew 30 mg by mouth daily at 12 noon.  Dispense: 30 tablet; Refill: 0  I have consulted the [place] Controlled Substances Registry for this patient, and feel the risk/benefit ratio today is favorable for proceeding with this prescription for a controlled substance. The patient understands monitoring parameters and red flags.   4. At risk for deficient intake of food Roshan was given extensive education and counseling today of more than 8 minutes on risks associated with deficient food intake.  Counseled her on the importance of following our prescribed meal plan and eating adequate amounts of protein.    5. Obesity, current BMI 37  Course: Pamela Hunt is currently in the action stage of change. As such, her goal is to continue with weight loss efforts.   Nutrition goals: She has agreed to practicing portion control and making smarter food choices, such as increasing vegetables and decreasing simple carbohydrates.   Exercise goals:  As is.  Behavioral modification strategies: increasing lean protein intake, decreasing simple carbohydrates, increasing vegetables, and emotional eating strategies.  Pamela Hunt has agreed to follow-up with our clinic in 3-4 weeks. She was informed of the importance of frequent follow-up visits to maximize her success with intensive lifestyle modifications for her multiple health conditions.   Objective:   Blood pressure 108/69, pulse 79, temperature 98  F (36.7 C), temperature source Oral, height 5\' 7"  (1.702 m), weight 236 lb (107 kg), SpO2 100 %, unknown if currently breastfeeding. Body mass index is 36.96 kg/m.  General: Cooperative, alert, well developed, in no acute distress. HEENT: Conjunctivae and lids unremarkable. Cardiovascular: Regular rhythm.  Lungs: Normal work of  breathing. Neurologic: No focal deficits.   Lab Results  Component Value Date   CREATININE 1.00 09/09/2020   BUN 9 09/09/2020   NA 139 09/09/2020   K 4.4 09/09/2020   CL 106 09/09/2020   CO2 19 (L) 09/09/2020   Lab Results  Component Value Date   ALT 22 09/09/2020   AST 25 09/09/2020   ALKPHOS 101 09/09/2020   BILITOT 0.3 09/09/2020   Lab Results  Component Value Date   HGBA1C 5.7 (H) 09/09/2020   Lab Results  Component Value Date   INSULIN 5.4 09/09/2020   Lab Results  Component Value Date   TSH 0.818 09/09/2020   Lab Results  Component Value Date   CHOL 146 09/09/2020   HDL 55 09/09/2020   LDLCALC 81 09/09/2020   TRIG 43 09/09/2020   CHOLHDL 2.7 09/09/2020   Lab Results  Component Value Date   VD25OH 18.6 (L) 09/09/2020   Lab Results  Component Value Date   WBC 4.9 09/09/2020   HGB 9.6 (L) 09/09/2020   HCT 30.7 (L) 09/09/2020   MCV 87 09/09/2020   PLT 265 09/09/2020   Lab Results  Component Value Date   IRON 45 09/09/2020   TIBC 383 09/09/2020   FERRITIN 13 (L) 09/09/2020   Attestation Statements:   Reviewed by clinician on day of visit: allergies, medications, problem list, medical history, surgical history, family history, social history, and previous encounter notes.  I, 09/11/2020, CMA, am acting as transcriptionist for Insurance claims handler, DO  I have reviewed the above documentation for accuracy and completeness, and I agree with the above. Helane Rima, DO

## 2021-01-11 ENCOUNTER — Encounter (INDEPENDENT_AMBULATORY_CARE_PROVIDER_SITE_OTHER): Payer: Self-pay | Admitting: Family Medicine

## 2021-01-11 DIAGNOSIS — R7303 Prediabetes: Secondary | ICD-10-CM

## 2021-01-12 NOTE — Telephone Encounter (Signed)
Pt last seen by Dr. Wallace.  

## 2021-01-13 ENCOUNTER — Other Ambulatory Visit (INDEPENDENT_AMBULATORY_CARE_PROVIDER_SITE_OTHER): Payer: Self-pay | Admitting: Family Medicine

## 2021-01-13 DIAGNOSIS — R7303 Prediabetes: Secondary | ICD-10-CM

## 2021-01-13 MED ORDER — TIRZEPATIDE 2.5 MG/0.5ML ~~LOC~~ SOAJ
2.5000 mg | SUBCUTANEOUS | 0 refills | Status: DC
Start: 2021-01-13 — End: 2021-01-27

## 2021-01-14 ENCOUNTER — Encounter (INDEPENDENT_AMBULATORY_CARE_PROVIDER_SITE_OTHER): Payer: Self-pay

## 2021-01-14 NOTE — Telephone Encounter (Signed)
Prior authorization has been started for Mounjaro. Will notify patient and provider once response received.  

## 2021-01-14 NOTE — Telephone Encounter (Signed)
Last OV with Dr Wallace 

## 2021-01-27 ENCOUNTER — Other Ambulatory Visit: Payer: Self-pay

## 2021-01-27 ENCOUNTER — Encounter (INDEPENDENT_AMBULATORY_CARE_PROVIDER_SITE_OTHER): Payer: Self-pay | Admitting: Family Medicine

## 2021-01-27 ENCOUNTER — Ambulatory Visit (INDEPENDENT_AMBULATORY_CARE_PROVIDER_SITE_OTHER): Payer: 59 | Admitting: Family Medicine

## 2021-01-27 VITALS — BP 112/76 | HR 74 | Temp 97.9°F | Ht 67.0 in | Wt 236.0 lb

## 2021-01-27 DIAGNOSIS — Z9884 Bariatric surgery status: Secondary | ICD-10-CM | POA: Diagnosis not present

## 2021-01-27 DIAGNOSIS — J45909 Unspecified asthma, uncomplicated: Secondary | ICD-10-CM | POA: Insufficient documentation

## 2021-01-27 DIAGNOSIS — F5081 Binge eating disorder: Secondary | ICD-10-CM

## 2021-01-27 DIAGNOSIS — Z6839 Body mass index (BMI) 39.0-39.9, adult: Secondary | ICD-10-CM

## 2021-01-27 DIAGNOSIS — N870 Mild cervical dysplasia: Secondary | ICD-10-CM | POA: Insufficient documentation

## 2021-01-27 DIAGNOSIS — R7303 Prediabetes: Secondary | ICD-10-CM

## 2021-01-27 DIAGNOSIS — E559 Vitamin D deficiency, unspecified: Secondary | ICD-10-CM | POA: Insufficient documentation

## 2021-01-27 MED ORDER — VYVANSE 30 MG PO CHEW: 30.0000 mg | CHEWABLE_TABLET | Freq: Every day | ORAL | 0 refills | Status: DC

## 2021-01-27 MED ORDER — TIRZEPATIDE 5 MG/0.5ML ~~LOC~~ SOAJ: 5.0000 mg | SUBCUTANEOUS | 0 refills | Status: DC

## 2021-01-27 NOTE — Progress Notes (Signed)
Chief Complaint:   OBESITY Pamela Hunt is here to discuss her progress with her obesity treatment plan along with follow-up of her obesity related diagnoses.   Today's visit was #: 6 Starting weight: 254 lbs Starting date: 09/09/2020 Today's weight: 236 lbs Today's date: 01/27/2021 Weight change since last visit: 0 Total lbs lost to date: 18 lbs Body mass index is 36.96 kg/m.  Total weight loss percentage to date: -7.09%  Current Meal Plan: practicing portion control and making smarter food choices, such as increasing vegetables and decreasing simple carbohydrates for 70% of the time.  Current Exercise Plan: Strength training/cardio for 60 minutes 3 times per week. Current Anti-Obesity Medications: Mounjaro 2.5 mg subcutaneously weekly. Side effects: None.  Interim History:  Pamela Hunt was not able to start [facility] until last Thursday due to a pharmacy issue.  She says that her job stress has been increasing.  Assessment/Plan:   1. Prediabetes, with polyphagia Not optimized. Goal is HgbA1c < 5.7.  Medication: Mounjaro 2.5 mg subcutaneously weekly.    Plan:  Increase Mounjaro to 5 mg subcutaneously weekly, as per below.  She will continue to focus on protein-rich, low simple carbohydrate foods. We reviewed the importance of hydration, regular exercise for stress reduction, and restorative sleep.   Lab Results  Component Value Date   HGBA1C 5.7 (H) 09/09/2020   Lab Results  Component Value Date   INSULIN 5.4 09/09/2020   - Increase tirzepatide (MOUNJARO) 5 MG/0.5ML Pen; Inject 5 mg into the skin once a week.  Dispense: 2 mL; Refill: 0  2. Vitamin D deficiency Not at goal.  She is taking vitamin D 50,000 IU weekly.  Plan: Continue to take prescription Vitamin D @50 ,000 IU every week as prescribed.  Follow-up for routine testing of Vitamin D, at least 2-3 times per year to avoid over-replacement.  Lab Results  Component Value Date   VD25OH 18.6 (L) 09/09/2020   3. History  of gastric bypass [name] is at risk for malnutrition due to her previous bariatric surgery.   Counseling You may need to eat 3 meals and 2 snacks, or 5 small meals each day in order to reach your protein and calorie goals.  Allow at least 15 minutes for each meal so that you can eat mindfully. Listen to your body so that you do not overeat. For most people, your sleeve or pouch will comfortably hold 4-6 ounces. Eat foods from all food groups. This includes fruits and vegetables, grains, dairy, and meat and other proteins. Include a protein-rich food at every meal and snack, and eat the protein food first.  You should be taking a Bariatric Multivitamin as well as calcium.   4. Binge eating disorder Pamela Hunt is taking Vyvanse 30 mg daily for BED.  The current medical regimen is effective;  continue present plan and medications.  I have consulted the [place] Controlled Substances Registry for this patient, and feel the risk/benefit ratio today is favorable for proceeding with this prescription for a controlled substance. The patient understands monitoring parameters and red flags.   - Refill Lisdexamfetamine Dimesylate (VYVANSE) 30 MG CHEW; Chew 30 mg by mouth daily at 12 noon.  Dispense: 30 tablet; Refill: 0  5. Obesity, current BMI 37  Course: [name] is currently in the action stage of change. As such, her goal is to continue with weight loss efforts.   Nutrition goals: She has agreed to practicing portion control and making smarter food choices, such as increasing vegetables and decreasing simple  carbohydrates.   Exercise goals:  As is.  Behavioral modification strategies: increasing lean protein intake, decreasing simple carbohydrates, increasing vegetables, and increasing water intake.  Pamela Hunt has agreed to follow-up with our clinic in 4 weeks. She was informed of the importance of frequent follow-up visits to maximize her success with intensive lifestyle modifications for her multiple  health conditions.   Objective:   Blood pressure 112/76, pulse 74, temperature 97.9 F (36.6 C), temperature source Oral, height 5\' 7"  (1.702 m), weight 236 lb (107 kg), SpO2 100 %, unknown if currently breastfeeding. Body mass index is 36.96 kg/m.  General: Cooperative, alert, well developed, in no acute distress. HEENT: Conjunctivae and lids unremarkable. Cardiovascular: Regular rhythm.  Lungs: Normal work of breathing. Neurologic: No focal deficits.   Lab Results  Component Value Date   CREATININE 1.00 09/09/2020   BUN 9 09/09/2020   NA 139 09/09/2020   K 4.4 09/09/2020   CL 106 09/09/2020   CO2 19 (L) 09/09/2020   Lab Results  Component Value Date   ALT 22 09/09/2020   AST 25 09/09/2020   ALKPHOS 101 09/09/2020   BILITOT 0.3 09/09/2020   Lab Results  Component Value Date   HGBA1C 5.7 (H) 09/09/2020   Lab Results  Component Value Date   INSULIN 5.4 09/09/2020   Lab Results  Component Value Date   TSH 0.818 09/09/2020   Lab Results  Component Value Date   CHOL 146 09/09/2020   HDL 55 09/09/2020   LDLCALC 81 09/09/2020   TRIG 43 09/09/2020   CHOLHDL 2.7 09/09/2020   Lab Results  Component Value Date   VD25OH 18.6 (L) 09/09/2020   Lab Results  Component Value Date   WBC 4.9 09/09/2020   HGB 9.6 (L) 09/09/2020   HCT 30.7 (L) 09/09/2020   MCV 87 09/09/2020   PLT 265 09/09/2020   Lab Results  Component Value Date   IRON 45 09/09/2020   TIBC 383 09/09/2020   FERRITIN 13 (L) 09/09/2020   Attestation Statements:   Reviewed by clinician on day of visit: allergies, medications, problem list, medical history, surgical history, family history, social history, and previous encounter notes.  I, 09/11/2020, CMA, am acting as transcriptionist for Insurance claims handler, DO  I have reviewed the above documentation for accuracy and completeness, and I agree with the above. -  Helane Rima, DO, MS, FAAFP, DABOM - Family and Bariatric Medicine.

## 2021-01-29 ENCOUNTER — Encounter (INDEPENDENT_AMBULATORY_CARE_PROVIDER_SITE_OTHER): Payer: Self-pay

## 2021-02-17 ENCOUNTER — Encounter (INDEPENDENT_AMBULATORY_CARE_PROVIDER_SITE_OTHER): Payer: Self-pay | Admitting: Family Medicine

## 2021-02-17 ENCOUNTER — Ambulatory Visit (INDEPENDENT_AMBULATORY_CARE_PROVIDER_SITE_OTHER): Payer: 59 | Admitting: Family Medicine

## 2021-02-17 ENCOUNTER — Other Ambulatory Visit: Payer: Self-pay

## 2021-02-17 VITALS — BP 120/82 | HR 72 | Temp 98.0°F | Ht 67.0 in | Wt 227.0 lb

## 2021-02-17 DIAGNOSIS — R7303 Prediabetes: Secondary | ICD-10-CM

## 2021-02-17 DIAGNOSIS — F5081 Binge eating disorder: Secondary | ICD-10-CM

## 2021-02-17 DIAGNOSIS — E559 Vitamin D deficiency, unspecified: Secondary | ICD-10-CM

## 2021-02-17 DIAGNOSIS — Z9189 Other specified personal risk factors, not elsewhere classified: Secondary | ICD-10-CM

## 2021-02-17 DIAGNOSIS — Z6839 Body mass index (BMI) 39.0-39.9, adult: Secondary | ICD-10-CM

## 2021-02-17 MED ORDER — VITAMIN D (ERGOCALCIFEROL) 1.25 MG (50000 UNIT) PO CAPS: 50000.0000 [IU] | ORAL_CAPSULE | ORAL | 0 refills | Status: DC

## 2021-02-17 MED ORDER — VYVANSE 30 MG PO CHEW: 30.0000 mg | CHEWABLE_TABLET | Freq: Every day | ORAL | 0 refills | Status: DC

## 2021-02-18 ENCOUNTER — Other Ambulatory Visit (INDEPENDENT_AMBULATORY_CARE_PROVIDER_SITE_OTHER): Payer: Self-pay | Admitting: Family Medicine

## 2021-02-18 DIAGNOSIS — E559 Vitamin D deficiency, unspecified: Secondary | ICD-10-CM

## 2021-02-18 NOTE — Progress Notes (Signed)
Chief Complaint:   OBESITY Pamela Hunt is here to discuss her progress with her obesity treatment plan along with follow-up of her obesity related diagnoses. Pamela Hunt is on practicing portion control and making smarter food choices, such as increasing vegetables and decreasing simple carbohydrates and states she is following her eating plan approximately 80% of the time. Pamela Hunt states she is exercising with a personal trainer for 60 minutes 3 times per week.  Today's visit was #: 7 Starting weight: 254 lbs Starting date: 09/09/2020 Today's weight: 227 lbs Today's date: 02/17/2021 Total lbs lost to date: 27 Total lbs lost since last in-office visit: 9  Interim History: Pamela Hunt is a patient of Dr. Philis Pique, and this is her first office visit with me. She is doing really well with portion control and smarter choices. She is getting protein in with each meal and she has 3 protein shakes as supplements throughout the day. She denies cravings or hunger, and she has no concerns.  Subjective:   1. Pre-diabetes, with polyphagia Pamela Hunt has been on Mounjaro for 3 weeks or so. She notes less snacking and decreased appetite. She is still only on 2.5 mg q weekly.  2. Vitamin D deficiency Pamela Hunt is currently taking prescription vitamin D 50,000 IU each week. She denies nausea, vomiting or muscle weakness.  3. Binge eating disorder Pamela Hunt notes Vyvanse helps with ADHD which she has been diagnosed with in the past, and she has decreased appetite and binge eating disorder tendencies.  4. At risk for constipation Pamela Hunt is at increased risk for constipation due to increased protein in her diet.  Assessment/Plan:  No orders of the defined types were placed in this encounter.   Medications Discontinued During This Encounter  Medication Reason   Vitamin D, Ergocalciferol, (DRISDOL) 1.25 MG (50000 UNIT) CAPS capsule Reorder   Lisdexamfetamine Dimesylate (VYVANSE) 30 MG CHEW Reorder     Meds  ordered this encounter  Medications   Lisdexamfetamine Dimesylate (VYVANSE) 30 MG CHEW    Sig: Chew 30 mg by mouth daily at 12 noon.    Dispense:  30 tablet    Refill:  0    Only fill when due. Around 7th Nov.    30 d supply;  ** OV for RF **   Do not send RF request   Vitamin D, Ergocalciferol, (DRISDOL) 1.25 MG (50000 UNIT) CAPS capsule    Sig: Take 1 capsule (50,000 Units total) by mouth every 7 (seven) days.    Dispense:  4 capsule    Refill:  0    30 d supply;  ** OV for RF **   Do not send RF request     1. Pre-diabetes, with polyphagia Pamela Hunt will continue Mounjaro at 5 mg, and she will pick up her new script and started in the near future. She will continue to work on weight loss, exercise, and decreasing simple carbohydrates to help decrease the risk of diabetes.   2. Vitamin D deficiency Low Vitamin D level contributes to fatigue and are associated with obesity, breast, and colon cancer. We will refill prescription Vitamin D for 1 month. Pamela Hunt will follow-up for routine testing of Vitamin D, at least 2-3 times per year to avoid over-replacement.  - Vitamin D, Ergocalciferol, (DRISDOL) 1.25 MG (50000 UNIT) CAPS capsule; Take 1 capsule (50,000 Units total) by mouth every 7 (seven) days.  Dispense: 4 capsule; Refill: 0  3. Binge eating disorder Pamela Hunt will continue her medications, and we will refill Vyvanse for  1 month.  - Lisdexamfetamine Dimesylate (VYVANSE) 30 MG CHEW; Chew 30 mg by mouth daily at 12 noon.  Dispense: 30 tablet; Refill: 0  4. At risk for constipation Pamela Hunt was given approximately 15 minutes of counseling today regarding prevention of constipation. She was encouraged to increase water and fiber intake.   5. Obesity BMI today is 1 Pamela Hunt is currently in the action stage of change. As such, her goal is to continue with weight loss efforts. She has agreed to practicing portion control and making smarter food choices, such as increasing vegetables and  decreasing simple carbohydrates.   Exercise goals: As is.  Behavioral modification strategies: no skipping meals.  Pamela Hunt has agreed to follow-up with our clinic in 3 to 4 weeks. She was informed of the importance of frequent follow-up visits to maximize her success with intensive lifestyle modifications for her multiple health conditions.   Objective:   Blood pressure 120/82, pulse 72, temperature 98 F (36.7 C), height 5\' 7"  (1.702 m), weight 227 lb (103 kg), SpO2 100 %, unknown if currently breastfeeding. Body mass index is 35.55 kg/m.  General: Cooperative, alert, well developed, in no acute distress. HEENT: Conjunctivae and lids unremarkable. Cardiovascular: Regular rhythm.  Lungs: Normal work of breathing. Neurologic: No focal deficits.   Lab Results  Component Value Date   CREATININE 1.00 09/09/2020   BUN 9 09/09/2020   NA 139 09/09/2020   K 4.4 09/09/2020   CL 106 09/09/2020   CO2 19 (L) 09/09/2020   Lab Results  Component Value Date   ALT 22 09/09/2020   AST 25 09/09/2020   ALKPHOS 101 09/09/2020   BILITOT 0.3 09/09/2020   Lab Results  Component Value Date   HGBA1C 5.7 (H) 09/09/2020   Lab Results  Component Value Date   INSULIN 5.4 09/09/2020   Lab Results  Component Value Date   TSH 0.818 09/09/2020   Lab Results  Component Value Date   CHOL 146 09/09/2020   HDL 55 09/09/2020   LDLCALC 81 09/09/2020   TRIG 43 09/09/2020   CHOLHDL 2.7 09/09/2020   Lab Results  Component Value Date   VD25OH 18.6 (L) 09/09/2020   Lab Results  Component Value Date   WBC 4.9 09/09/2020   HGB 9.6 (L) 09/09/2020   HCT 30.7 (L) 09/09/2020   MCV 87 09/09/2020   PLT 265 09/09/2020   Lab Results  Component Value Date   IRON 45 09/09/2020   TIBC 383 09/09/2020   FERRITIN 13 (L) 09/09/2020   Attestation Statements:   Reviewed by clinician on day of visit: allergies, medications, problem list, medical history, surgical history, family history, social history,  and previous encounter notes.   09/11/2020, am acting as transcriptionist for Trude Mcburney, DO.  I have reviewed the above documentation for accuracy and completeness, and I agree with the above. [employer], D.O.  The 21st Century Cures Act was signed into law in 2016 which includes the topic of electronic health records.  This provides immediate access to information in MyChart.  This includes consultation notes, operative notes, office notes, lab results and pathology reports.  If you have any questions about what you read please let 2017 know at your next visit so we can discuss your concerns and take corrective action if need be.  We are right here with you.

## 2021-02-19 NOTE — Telephone Encounter (Signed)
Pt last seen by Dr. Opalski.  

## 2021-03-12 ENCOUNTER — Other Ambulatory Visit (INDEPENDENT_AMBULATORY_CARE_PROVIDER_SITE_OTHER): Payer: Self-pay | Admitting: Family Medicine

## 2021-03-12 DIAGNOSIS — R7303 Prediabetes: Secondary | ICD-10-CM

## 2021-03-16 ENCOUNTER — Ambulatory Visit (INDEPENDENT_AMBULATORY_CARE_PROVIDER_SITE_OTHER): Payer: 59 | Admitting: Family Medicine

## 2021-03-16 ENCOUNTER — Encounter (INDEPENDENT_AMBULATORY_CARE_PROVIDER_SITE_OTHER): Payer: Self-pay | Admitting: Family Medicine

## 2021-03-16 ENCOUNTER — Other Ambulatory Visit: Payer: Self-pay

## 2021-03-16 VITALS — BP 106/70 | HR 78 | Temp 98.0°F | Ht 67.0 in | Wt 223.0 lb

## 2021-03-16 DIAGNOSIS — R7303 Prediabetes: Secondary | ICD-10-CM | POA: Diagnosis not present

## 2021-03-16 DIAGNOSIS — Z6839 Body mass index (BMI) 39.0-39.9, adult: Secondary | ICD-10-CM

## 2021-03-16 DIAGNOSIS — F5081 Binge eating disorder: Secondary | ICD-10-CM

## 2021-03-16 DIAGNOSIS — E559 Vitamin D deficiency, unspecified: Secondary | ICD-10-CM

## 2021-03-16 MED ORDER — TIRZEPATIDE 5 MG/0.5ML ~~LOC~~ SOAJ: 5.0000 mg | SUBCUTANEOUS | 1 refills | Status: DC

## 2021-03-16 MED ORDER — VYVANSE 30 MG PO CHEW
30.0000 mg | CHEWABLE_TABLET | Freq: Every day | ORAL | 0 refills | Status: DC
Start: 2021-03-16 — End: 2021-04-23

## 2021-03-16 MED ORDER — VITAMIN D (ERGOCALCIFEROL) 1.25 MG (50000 UNIT) PO CAPS
50000.0000 [IU] | ORAL_CAPSULE | ORAL | 1 refills | Status: DC
Start: 2021-03-16 — End: 2021-04-23

## 2021-03-17 ENCOUNTER — Encounter (INDEPENDENT_AMBULATORY_CARE_PROVIDER_SITE_OTHER): Payer: Self-pay

## 2021-03-17 ENCOUNTER — Telehealth (INDEPENDENT_AMBULATORY_CARE_PROVIDER_SITE_OTHER): Payer: Self-pay | Admitting: Family Medicine

## 2021-03-17 NOTE — Telephone Encounter (Signed)
Prior authorization denied for Mounjaro. Patient sent mychart message.  

## 2021-03-17 NOTE — Progress Notes (Signed)
Chief Complaint:   OBESITY Pamela Hunt is here to discuss her progress with her obesity treatment plan along with follow-up of her obesity related diagnoses. See Medical Weight Management Flowsheet for complete bioelectrical impedance results.  Today's visit was #: 8 Starting weight: 254 lbs Starting date: 09/09/2020 Weight change since last visit: 4 lbs Total lbs lost to date: 31 lbs Total weight loss percentage to date: -12.20%  Nutrition Plan: Practicing portion control and making smarter food choices, such as increasing vegetables and decreasing simple carbohydrates for 75% of the time.  Activity: Strength training/cardio for 60 minutes 3 times per week.  Anti-obesity medications: Mounjaro 5 mg subcutaneously weekly. Reported side effects: None.  Interim History: Pamela Hunt says she is doing well.  She has minimal nausea with Mounjaro.  Some diarrhea with food indiscretions.  She enjoyed the holidays and her birthday.   Assessment/Plan:   1. Prediabetes, with polyphagia At goal. Goal is HgbA1c < 5.7.  Medication: Mounjaro 5 mg subcutaneously weekly.    Plan:  Continue Mounjaro 5 mg subcutaneously weekly.  Will refill today, as per below.  She will continue to focus on protein-rich, low simple carbohydrate foods. We reviewed the importance of hydration, regular exercise for stress reduction, and restorative sleep.   Lab Results  Component Value Date   HGBA1C 5.7 (H) 09/09/2020   Lab Results  Component Value Date   INSULIN 5.4 09/09/2020   - Refill tirzepatide (MOUNJARO) 5 MG/0.5ML Pen; Inject 5 mg into the skin once a week.  Dispense: 2 mL; Refill: 1  2. Vitamin D deficiency Not at goal. She is taking vitamin D 50,000 IU weekly.  Plan: Continue to take prescription Vitamin D @50 ,000 IU every week as prescribed.  Follow-up for routine testing of Vitamin D, at least 2-3 times per year to avoid over-replacement.  Lab Results  Component Value Date   VD25OH 18.6 (L) 09/09/2020    - Refill Vitamin D, Ergocalciferol, (DRISDOL) 1.25 MG (50000 UNIT) CAPS capsule; Take 1 capsule (50,000 Units total) by mouth every 7 (seven) days.  Dispense: 4 capsule; Refill: 1  3. Binge eating disorder Pamela Hunt notes Vyvanse 30 mg daily helps with ADHD which she has been diagnosed with in the past, and she has decreased appetite and binge eating disorder tendencies.  Will refill Vyvanse today.  The goals for treatment of BED are to reduce eating binges and to achieve healthy eating habits. Because binge eating can correlate with negative emotions, treatment may also address any other mental-health issues, such as depression.  - Refill Lisdexamfetamine Dimesylate (VYVANSE) 30 MG CHEW; Chew 30 mg by mouth daily at 12 noon.  Dispense: 30 tablet; Refill: 0  I have consulted the [place] Controlled Substances Registry for this patient, and feel the risk/benefit ratio today is favorable for proceeding with this prescription for a controlled substance. The patient understands monitoring parameters and red flags.   4. Obesity BMI today is 35  Course: Pamela Hunt is currently in the action stage of change. As such, her goal is to continue with weight loss efforts.   Nutrition goals: She has agreed to practicing portion control and making smarter food choices, such as increasing vegetables and decreasing simple carbohydrates.   Exercise goals:  As is.  Behavioral modification strategies: increasing lean protein intake, decreasing simple carbohydrates, increasing vegetables, increasing water intake, and decreasing liquid calories.  Pamela Hunt has agreed to follow-up with our clinic in 4 weeks. She was informed of the importance of frequent follow-up visits to  maximize her success with intensive lifestyle modifications for her multiple health conditions.   Objective:   Blood pressure 106/70, pulse 78, temperature 98 F (36.7 C), temperature source Oral, height 5\' 7"  (1.702 m), weight 223 lb (101.2 kg), SpO2  96 %, unknown if currently breastfeeding. Body mass index is 34.93 kg/m.  General: Cooperative, alert, well developed, in no acute distress. HEENT: Conjunctivae and lids unremarkable. Cardiovascular: Regular rhythm.  Lungs: Normal work of breathing. Neurologic: No focal deficits.   Lab Results  Component Value Date   CREATININE 1.00 09/09/2020   BUN 9 09/09/2020   NA 139 09/09/2020   K 4.4 09/09/2020   CL 106 09/09/2020   CO2 19 (L) 09/09/2020   Lab Results  Component Value Date   ALT 22 09/09/2020   AST 25 09/09/2020   ALKPHOS 101 09/09/2020   BILITOT 0.3 09/09/2020   Lab Results  Component Value Date   HGBA1C 5.7 (H) 09/09/2020   Lab Results  Component Value Date   INSULIN 5.4 09/09/2020   Lab Results  Component Value Date   TSH 0.818 09/09/2020   Lab Results  Component Value Date   CHOL 146 09/09/2020   HDL 55 09/09/2020   LDLCALC 81 09/09/2020   TRIG 43 09/09/2020   CHOLHDL 2.7 09/09/2020   Lab Results  Component Value Date   VD25OH 18.6 (L) 09/09/2020   Lab Results  Component Value Date   WBC 4.9 09/09/2020   HGB 9.6 (L) 09/09/2020   HCT 30.7 (L) 09/09/2020   MCV 87 09/09/2020   PLT 265 09/09/2020   Lab Results  Component Value Date   IRON 45 09/09/2020   TIBC 383 09/09/2020   FERRITIN 13 (L) 09/09/2020   Attestation Statements:   Reviewed by clinician on day of visit: allergies, medications, problem list, medical history, surgical history, family history, social history, and previous encounter notes.  I, 09/11/2020, CMA, am acting as transcriptionist for Insurance claims handler, DO  I have reviewed the above documentation for accuracy and completeness, and I agree with the above. -  Helane Rima, DO, MS, FAAFP, DABOM - Family and Bariatric Medicine.

## 2021-04-02 ENCOUNTER — Encounter (INDEPENDENT_AMBULATORY_CARE_PROVIDER_SITE_OTHER): Payer: Self-pay | Admitting: Family Medicine

## 2021-04-06 ENCOUNTER — Other Ambulatory Visit (INDEPENDENT_AMBULATORY_CARE_PROVIDER_SITE_OTHER): Payer: Self-pay | Admitting: Family Medicine

## 2021-04-06 DIAGNOSIS — E559 Vitamin D deficiency, unspecified: Secondary | ICD-10-CM

## 2021-04-06 NOTE — Telephone Encounter (Signed)
Pt last seen by Dr. Wallace.  

## 2021-04-06 NOTE — Telephone Encounter (Signed)
Dr.Wallace °

## 2021-04-09 ENCOUNTER — Encounter (INDEPENDENT_AMBULATORY_CARE_PROVIDER_SITE_OTHER): Payer: Self-pay

## 2021-04-23 ENCOUNTER — Other Ambulatory Visit: Payer: Self-pay

## 2021-04-23 ENCOUNTER — Encounter (INDEPENDENT_AMBULATORY_CARE_PROVIDER_SITE_OTHER): Payer: Self-pay | Admitting: Family Medicine

## 2021-04-23 ENCOUNTER — Ambulatory Visit (INDEPENDENT_AMBULATORY_CARE_PROVIDER_SITE_OTHER): Payer: 59 | Admitting: Family Medicine

## 2021-04-23 VITALS — BP 127/79 | HR 77 | Temp 98.0°F | Ht 67.0 in | Wt 223.0 lb

## 2021-04-23 DIAGNOSIS — F5081 Binge eating disorder: Secondary | ICD-10-CM

## 2021-04-23 DIAGNOSIS — Z6839 Body mass index (BMI) 39.0-39.9, adult: Secondary | ICD-10-CM

## 2021-04-23 DIAGNOSIS — Z6834 Body mass index (BMI) 34.0-34.9, adult: Secondary | ICD-10-CM

## 2021-04-23 DIAGNOSIS — E559 Vitamin D deficiency, unspecified: Secondary | ICD-10-CM

## 2021-04-23 DIAGNOSIS — R7303 Prediabetes: Secondary | ICD-10-CM | POA: Diagnosis not present

## 2021-04-23 MED ORDER — VYVANSE 30 MG PO CHEW: 30.0000 mg | CHEWABLE_TABLET | Freq: Every day | ORAL | 0 refills | Status: DC

## 2021-04-23 MED ORDER — VITAMIN D (ERGOCALCIFEROL) 1.25 MG (50000 UNIT) PO CAPS: 50000.0000 [IU] | ORAL_CAPSULE | ORAL | 0 refills | Status: DC

## 2021-04-23 MED ORDER — TIRZEPATIDE 5 MG/0.5ML ~~LOC~~ SOAJ: 5.0000 mg | SUBCUTANEOUS | 3 refills | Status: DC

## 2021-04-27 NOTE — Progress Notes (Signed)
Chief Complaint:   OBESITY Pamela Hunt is here to discuss her progress with her obesity treatment plan along with follow-up of her obesity related diagnoses. See Medical Weight Management Flowsheet for complete bioelectrical impedance results.  Today's visit was #: Practicing portion control and making smarter food choices, such as increasing vegetables and decreasing simple carbohydrates for 60% of the time. Starting weight: 254 lbs Starting date: 09/09/2020 Weight change since last visit: 0 Total lbs lost to date: 31 lbs Total weight loss percentage to date: -12.20%  Nutrition Plan: Practicing portion control and making smarter food choices, such as increasing vegetables and decreasing simple carbohydrates for 60% of the time. Activity: Cardio/strength training for 60 minutes 3 times per week.  Anti-obesity medications: Mounjaro 5 mg subcutaneously weekly. Reported side effects: None.  Interim History: Pamela Hunt has increased her protein and water intake.  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  She is up in water weight today.  Assessment/Plan:   1. Prediabetes, with polyphagia At goal. Goal is HgbA1c < 5.7.  Medication: Mounjaro 5 mg subcutaneously weekly.    Plan:  Continue Mounjaro 5 mg subcutaneously weekly, as per below.  She will continue to focus on protein-rich, low simple carbohydrate foods. We reviewed the importance of hydration, regular exercise for stress reduction, and restorative sleep.   Lab Results  Component Value Date   HGBA1C 5.7 (H) 09/09/2020   Lab Results  Component Value Date   INSULIN 5.4 09/09/2020   - Refill tirzepatide (MOUNJARO) 5 MG/0.5ML Pen; Inject 5 mg into the skin once a week.  Dispense: 2 mL; Refill: 3  2. Vitamin D deficiency Not at goal. She is taking vitamin D 50,000 IU weekly.  Plan: Continue to take prescription Vitamin D @50 ,000 IU every week as prescribed.  Follow-up for routine testing of Vitamin D, at least 2-3 times per year to avoid  over-replacement.  Lab Results  Component Value Date   VD25OH 18.6 (L) 09/09/2020   - Refill Vitamin D, Ergocalciferol, (DRISDOL) 1.25 MG (50000 UNIT) CAPS capsule; Take 1 capsule (50,000 Units total) by mouth every 7 (seven) days.  Dispense: 12 capsule; Refill: 0  3. Binge eating disorder Pamela Hunt notes Vyvanse 30 mg daily helps with ADHD, which she was diagnosed with in the past, and she has decreased appetite and binge eating disorder tendencies.  Will refill Vyvanse for 3 months today.  - Refill Lisdexamfetamine Dimesylate (VYVANSE) 30 MG CHEW; Chew 30 mg by mouth daily at 12 noon.  Dispense: 30 tablet; Refill: 0 - Lisdexamfetamine Dimesylate (VYVANSE) 30 MG CHEW; Chew 30 mg by mouth daily.  Dispense: 30 tablet; Refill: 0 - Lisdexamfetamine Dimesylate (VYVANSE) 30 MG CHEW; Chew 30 mg by mouth daily.  Dispense: 30 tablet; Refill: 0  I have consulted the [place] Controlled Substances Registry for this patient, and feel the risk/benefit ratio today is favorable for proceeding with this prescription for a controlled substance. The patient understands monitoring parameters and red flags.   4. Obesity BMI today is 34.9  Course: Pamela Hunt is currently in the action stage of change. As such, her goal is to continue with weight loss efforts.   Nutrition goals: She has agreed to practicing portion control and making smarter food choices, such as increasing vegetables and decreasing simple carbohydrates.   Exercise goals:  As is.  Behavioral modification strategies: increasing lean protein intake, decreasing simple carbohydrates, increasing vegetables, and increasing water intake.  Pamela Hunt has agreed to follow-up with our clinic in 8 weeks. She was  informed of the importance of frequent follow-up visits to maximize her success with intensive lifestyle modifications for her multiple health conditions.   Objective:   Blood pressure 127/79, pulse 77, temperature 98 F (36.7 C), temperature source Oral,  height 5\' 7"  (1.702 m), weight 223 lb (101.2 kg), SpO2 100 %, unknown if currently breastfeeding. Body mass index is 34.93 kg/m.  General: Cooperative, alert, well developed, in no acute distress. HEENT: Conjunctivae and lids unremarkable. Cardiovascular: Regular rhythm.  Lungs: Normal work of breathing. Neurologic: No focal deficits.   Lab Results  Component Value Date   CREATININE 1.00 09/09/2020   BUN 9 09/09/2020   NA 139 09/09/2020   K 4.4 09/09/2020   CL 106 09/09/2020   CO2 19 (L) 09/09/2020   Lab Results  Component Value Date   ALT 22 09/09/2020   AST 25 09/09/2020   ALKPHOS 101 09/09/2020   BILITOT 0.3 09/09/2020   Lab Results  Component Value Date   HGBA1C 5.7 (H) 09/09/2020   Lab Results  Component Value Date   INSULIN 5.4 09/09/2020   Lab Results  Component Value Date   TSH 0.818 09/09/2020   Lab Results  Component Value Date   CHOL 146 09/09/2020   HDL 55 09/09/2020   LDLCALC 81 09/09/2020   TRIG 43 09/09/2020   CHOLHDL 2.7 09/09/2020   Lab Results  Component Value Date   VD25OH 18.6 (L) 09/09/2020   Lab Results  Component Value Date   WBC 4.9 09/09/2020   HGB 9.6 (L) 09/09/2020   HCT 30.7 (L) 09/09/2020   MCV 87 09/09/2020   PLT 265 09/09/2020   Lab Results  Component Value Date   IRON 45 09/09/2020   TIBC 383 09/09/2020   FERRITIN 13 (L) 09/09/2020   Attestation Statements:   Reviewed by clinician on day of visit: allergies, medications, problem list, medical history, surgical history, family history, social history, and previous encounter notes.  I, Insurance claims handlerAmber Agner, CMA, am acting as transcriptionist for Helane RimaErica Symeon Puleo, DO  I have reviewed the above documentation for accuracy and completeness, and I agree with the above. -  Helane RimaErica Barney Gertsch, DO, MS, FAAFP, DABOM - Family and Bariatric Medicine.

## 2021-06-09 ENCOUNTER — Encounter (INDEPENDENT_AMBULATORY_CARE_PROVIDER_SITE_OTHER): Payer: Self-pay | Admitting: Family Medicine

## 2021-06-09 ENCOUNTER — Other Ambulatory Visit: Payer: Self-pay

## 2021-06-09 ENCOUNTER — Ambulatory Visit (INDEPENDENT_AMBULATORY_CARE_PROVIDER_SITE_OTHER): Payer: 59 | Admitting: Family Medicine

## 2021-06-09 VITALS — BP 114/73 | HR 76 | Temp 97.8°F | Ht 67.0 in | Wt 215.0 lb

## 2021-06-09 DIAGNOSIS — E559 Vitamin D deficiency, unspecified: Secondary | ICD-10-CM

## 2021-06-09 DIAGNOSIS — Z6833 Body mass index (BMI) 33.0-33.9, adult: Secondary | ICD-10-CM

## 2021-06-09 DIAGNOSIS — R7303 Prediabetes: Secondary | ICD-10-CM

## 2021-06-09 DIAGNOSIS — E669 Obesity, unspecified: Secondary | ICD-10-CM | POA: Diagnosis not present

## 2021-06-09 DIAGNOSIS — F50819 Binge eating disorder, unspecified: Secondary | ICD-10-CM

## 2021-06-09 DIAGNOSIS — F5081 Binge eating disorder: Secondary | ICD-10-CM

## 2021-06-09 DIAGNOSIS — Z6839 Body mass index (BMI) 39.0-39.9, adult: Secondary | ICD-10-CM

## 2021-06-09 DIAGNOSIS — E66812 Obesity, class 2: Secondary | ICD-10-CM

## 2021-06-09 MED ORDER — VITAMIN D (ERGOCALCIFEROL) 1.25 MG (50000 UNIT) PO CAPS: 50000.0000 [IU] | ORAL_CAPSULE | ORAL | 0 refills | Status: DC

## 2021-06-09 MED ORDER — TIRZEPATIDE 5 MG/0.5ML ~~LOC~~ SOAJ: 5.0000 mg | SUBCUTANEOUS | 3 refills | Status: DC

## 2021-06-09 MED ORDER — VYVANSE 30 MG PO CHEW: 30.0000 mg | CHEWABLE_TABLET | Freq: Every day | ORAL | 0 refills | Status: DC

## 2021-06-09 NOTE — Progress Notes (Signed)
Chief Complaint:   OBESITY Pamela Hunt is here to discuss her progress with her obesity treatment plan along with follow-up of her obesity related diagnoses. See Medical Weight Management Flowsheet for complete bioelectrical impedance results.  Today's visit was #: 10 Starting weight: 254 lbs Starting date: 09/09/2020 Weight change since last visit: 8 lbs Total lbs lost to date: 39 lbs Total weight loss percentage to date: -15.35%  Nutrition Plan: Practicing portion control and making smarter food choices, such as increasing vegetables and decreasing simple carbohydrates for 60% of the time. Activity: Cardio/strength training for 60 minutes 3 times per week. Anti-obesity medications: Mounjaro 5 mg subcutaneously weekly. Reported side effects: None.  Interim History: Pamela Hunt says she is supplementing with protein shakes.  Assessment/Plan:   1. Prediabetes, with polyphagia At goal. Goal is HgbA1c < 5.7.  Medication: Mounjaro 5 mg subcutaneously weekly.    Plan:  Continue Mounjaro 5 mg subcutaneously weekly.  Will refill today.  She will continue to focus on protein-rich, low simple carbohydrate foods. We reviewed the importance of hydration, regular exercise for stress reduction, and restorative sleep.   Lab Results  Component Value Date   HGBA1C 5.7 (H) 09/09/2020   Lab Results  Component Value Date   INSULIN 5.4 09/09/2020   - Refill tirzepatide (MOUNJARO) 5 MG/0.5ML Pen; Inject 5 mg into the skin once a week.  Dispense: 2 mL; Refill: 3  2. Vitamin D deficiency Not at goal. She is taking vitamin D 50,000 IU weekly.  Plan: Continue to take prescription Vitamin D @50 ,000 IU every week as prescribed.  Follow-up for routine testing of Vitamin D, at least 2-3 times per year to avoid over-replacement.  Lab Results  Component Value Date   VD25OH 18.6 (L) 09/09/2020   - Refill Vitamin D, Ergocalciferol, (DRISDOL) 1.25 MG (50000 UNIT) CAPS capsule; Take 1 capsule (50,000 Units  total) by mouth every 7 (seven) days.  Dispense: 12 capsule; Refill: 0  3. Binge eating disorder Pamela Hunt is taking Vyvanse 30 mg daily for BED.  Will refill today.  The goals for treatment of BED are to reduce eating binges and to achieve healthy eating habits. Because binge eating can correlate with negative emotions, treatment may also address any other mental-health issues, such as depression.  People who binge eat feel as if they don't have control over how much they eat and have feelings of guilt or self-loathing after a binge eating episode.  The FDA has approved Vyvanse as a treatment option for binge eating disorder. Vyvanse targets the brain's reward center by increasing the levels of dopamine and norepinephrine, the chemicals of the brain responsible for feelings of pleasure.  Mindful eating is the recommended nutritional approach to treating BED.   - Refill Lisdexamfetamine Dimesylate (VYVANSE) 30 MG CHEW; Chew 30 mg by mouth daily at 12 noon.  Dispense: 30 tablet; Refill: 0 - Lisdexamfetamine Dimesylate (VYVANSE) 30 MG CHEW; Chew 30 mg by mouth daily.  Dispense: 30 tablet; Refill: 0  4. Obesity BMI today is 33.7  Course: Pamela Hunt is currently in the action stage of change. As such, her goal is to continue with weight loss efforts.   Nutrition goals: She has agreed to practicing portion control and making smarter food choices, such as increasing vegetables and decreasing simple carbohydrates.   Exercise goals:  As is.  Behavioral modification strategies: increasing lean protein intake, decreasing simple carbohydrates, increasing vegetables, and increasing water intake.  Pamela Hunt has agreed to follow-up with our clinic in 4  weeks. She was informed of the importance of frequent follow-up visits to maximize her success with intensive lifestyle modifications for her multiple health conditions.   Objective:   Blood pressure 114/73, pulse 76, temperature 97.8 F (36.6 C), temperature  source Oral, height  (1.702 m), weight 215 lb (97.5 kg), SpO2 98 %, unknown if currently breastfeeding. Body mass index is 33.67 kg/m.  General: Cooperative, alert, well developed, in no acute distress. HEENT: Conjunctivae and lids unremarkable. Cardiovascular: Regular rhythm.  Lungs: Normal work of breathing. Neurologic: No focal deficits.   Lab Results  Component Value Date   CREATININE 1.00 09/09/2020   BUN 9 09/09/2020   NA 139 09/09/2020   K 4.4 09/09/2020   CL 106 09/09/2020   CO2 19 (L) 09/09/2020   Lab Results  Component Value Date   ALT 22 09/09/2020   AST 25 09/09/2020   ALKPHOS 101 09/09/2020   BILITOT 0.3 09/09/2020   Lab Results  Component Value Date   HGBA1C 5.7 (H) 09/09/2020   Lab Results  Component Value Date   INSULIN 5.4 09/09/2020   Lab Results  Component Value Date   TSH 0.818 09/09/2020   Lab Results  Component Value Date   CHOL 146 09/09/2020   HDL 55 09/09/2020   LDLCALC 81 09/09/2020   TRIG 43 09/09/2020   CHOLHDL 2.7 09/09/2020   Lab Results  Component Value Date   VD25OH 18.6 (L) 09/09/2020   Lab Results  Component Value Date   WBC 4.9 09/09/2020   HGB 9.6 (L) 09/09/2020   HCT 30.7 (L) 09/09/2020   MCV 87 09/09/2020   PLT 265 09/09/2020   Lab Results  Component Value Date   IRON 45 09/09/2020   TIBC 383 09/09/2020   FERRITIN 13 (L) 09/09/2020   Attestation Statements:   Reviewed by clinician on day of visit: allergies, medications, problem list, medical history, surgical history, family history, social history, and previous encounter notes.  I, Insurance claims handler, CMA, am acting as transcriptionist for Helane Rima, DO  I have reviewed the above documentation for accuracy and completeness, and I agree with the above. -  Helane Rima, DO, MS, FAAFP, DABOM - Family and Bariatric Medicine.

## 2021-06-24 ENCOUNTER — Encounter (INDEPENDENT_AMBULATORY_CARE_PROVIDER_SITE_OTHER): Payer: Self-pay

## 2021-07-07 ENCOUNTER — Other Ambulatory Visit: Payer: Self-pay

## 2021-07-07 ENCOUNTER — Other Ambulatory Visit (HOSPITAL_COMMUNITY): Payer: Self-pay

## 2021-07-07 ENCOUNTER — Encounter (INDEPENDENT_AMBULATORY_CARE_PROVIDER_SITE_OTHER): Payer: Self-pay | Admitting: Family Medicine

## 2021-07-07 ENCOUNTER — Ambulatory Visit (INDEPENDENT_AMBULATORY_CARE_PROVIDER_SITE_OTHER): Payer: 59 | Admitting: Family Medicine

## 2021-07-07 VITALS — BP 105/70 | HR 65 | Temp 97.5°F | Ht 67.0 in | Wt 211.0 lb

## 2021-07-07 DIAGNOSIS — R7303 Prediabetes: Secondary | ICD-10-CM

## 2021-07-07 DIAGNOSIS — E559 Vitamin D deficiency, unspecified: Secondary | ICD-10-CM

## 2021-07-07 DIAGNOSIS — F5081 Binge eating disorder: Secondary | ICD-10-CM

## 2021-07-07 DIAGNOSIS — Z6833 Body mass index (BMI) 33.0-33.9, adult: Secondary | ICD-10-CM

## 2021-07-07 DIAGNOSIS — E669 Obesity, unspecified: Secondary | ICD-10-CM

## 2021-07-07 MED ORDER — TIRZEPATIDE 7.5 MG/0.5ML ~~LOC~~ SOAJ
7.5000 mg | SUBCUTANEOUS | 2 refills | Status: DC
  Filled 2021-07-07 – 2021-10-14 (×2): qty 6, 84d supply, fill #0

## 2021-07-07 MED ORDER — TIRZEPATIDE 5 MG/0.5ML ~~LOC~~ SOAJ
5.0000 mg | SUBCUTANEOUS | 3 refills | Status: DC
  Filled 2021-07-07: qty 2, 28d supply, fill #0

## 2021-07-07 MED ORDER — VYVANSE 30 MG PO CHEW
30.0000 mg | CHEWABLE_TABLET | Freq: Every day | ORAL | 0 refills | Status: DC
  Filled 2021-07-07 – 2021-08-10 (×2): qty 30, 30d supply, fill #0

## 2021-07-07 MED ORDER — VITAMIN D (ERGOCALCIFEROL) 1.25 MG (50000 UNIT) PO CAPS
50000.0000 [IU] | ORAL_CAPSULE | ORAL | 0 refills | Status: AC
  Filled 2021-07-07 – 2021-11-06 (×3): qty 4, 28d supply, fill #0
  Filled 2022-03-17 – 2022-06-18 (×3): qty 4, 28d supply, fill #1

## 2021-07-08 ENCOUNTER — Telehealth (INDEPENDENT_AMBULATORY_CARE_PROVIDER_SITE_OTHER): Payer: Self-pay

## 2021-07-08 NOTE — Telephone Encounter (Signed)
[employer] calling to request a prior [place] for [employer].  Please call [phone].  Ref [id number]. ?

## 2021-07-08 NOTE — Telephone Encounter (Signed)
Prior authorization has already been started. Waiting on response from insurance company. Will notify patient and provider once response is received.  ?

## 2021-07-09 ENCOUNTER — Encounter (INDEPENDENT_AMBULATORY_CARE_PROVIDER_SITE_OTHER): Payer: Self-pay

## 2021-07-15 ENCOUNTER — Other Ambulatory Visit (HOSPITAL_COMMUNITY): Payer: Self-pay

## 2021-07-16 NOTE — Progress Notes (Signed)
Chief Complaint:   OBESITY Pamela Hunt is here to discuss her progress with her obesity treatment plan along with follow-up of her obesity related diagnoses. See Medical Weight Management Flowsheet for complete bioelectrical impedance results.  Today's visit was #: 22 Starting weight: 254 lbs Starting date: 09/09/2020 Weight change since last visit: 4 lbs Total lbs lost to date: 43 lbs Total weight loss percentage to date: -16.93%  Nutrition Plan: Practicing portion control and making smarter food choices, such as increasing vegetables and decreasing simple carbohydrates for 80% of the time. Activity: Cardio/strength for 60 minutes 3 days per week. Anti-obesity medications: Mounjaro 5 mg subcutaneously weekly. Reported side effects: None.  Interim History: Pamela Hunt says it is still busy at work but is happy.  Assessment/Plan:   1. Vitamin D deficiency Not at goal. She is taking vitamin D 50,000 IU weekly.  Plan: Continue to take prescription Vitamin D @50 ,000 IU every week as prescribed.  Follow-up for routine testing of Vitamin D, at least 2-3 times per year to avoid over-replacement.  Lab Results  Component Value Date   VD25OH 18.6 (L) 09/09/2020   - Refill Vitamin D, Ergocalciferol, (DRISDOL) 1.25 MG (50000 UNIT) CAPS capsule; Take 1 capsule by mouth every 7 days.  Dispense: 12 capsule; Refill: 0  2. Prediabetes, with polyphagia At goal. Goal is HgbA1c < 5.7.  Medication: Mounjaro 5 mg subcutaneously weekly.    Plan: She will continue to focus on protein-rich, low simple carbohydrate foods. We reviewed the importance of hydration, regular exercise for stress reduction, and restorative sleep.   Lab Results  Component Value Date   HGBA1C 5.7 (H) 09/09/2020   Lab Results  Component Value Date   INSULIN 5.4 09/09/2020   - Increase tirzepatide (MOUNJARO) 5 MG/0.5ML Pen; Inject 1 pen (5 mg) into the skin once a week.  Dispense: 2 mL; Refill: 3 - Increase tirzepatide  (MOUNJARO) 7.5 MG/0.5ML Pen; Inject 1 pen (7.5 mg) into the skin once a week.  Dispense: 6 mL; Refill: 2  3. Binge eating disorder Pamela Hunt is taking Vyvanse 30 mg daily for BED.  Will refill today.  The goals for treatment of BED are to reduce eating binges and to achieve healthy eating habits. Because binge eating can correlate with negative emotions, treatment may also address any other mental-health issues, such as depression.  People who binge eat feel as if they don't have control over how much they eat and have feelings of guilt or self-loathing after a binge eating episode.  The FDA has approved Vyvanse as a treatment option for binge eating disorder. Vyvanse targets the brain's reward center by increasing the levels of dopamine and norepinephrine, the chemicals of the brain responsible for feelings of pleasure.  Mindful eating is the recommended nutritional approach to treating BED.   - Refill Lisdexamfetamine Dimesylate (VYVANSE) 30 MG CHEW; Chew 1 tablet by mouth daily at 12 noon.  Dispense: 30 tablet; Refill: 0  I have consulted the  Controlled Substances Registry for this patient, and feel the risk/benefit ratio today is favorable for proceeding with this prescription for a controlled substance. The patient understands monitoring parameters and red flags.   4. Obesity BMI today is 33.1  Course: Pamela Hunt is currently in the action stage of change. As such, her goal is to continue with weight loss efforts.   Nutrition goals: She has agreed to practicing portion control and making smarter food choices, such as increasing vegetables and decreasing simple carbohydrates.   Exercise goals:  As is.  Behavioral modification strategies: increasing lean protein intake, decreasing simple carbohydrates, and increasing vegetables.  Pamela Hunt has agreed to follow-up with our clinic in 6 weeks. She was informed of the importance of frequent follow-up visits to maximize her success with intensive  lifestyle modifications for her multiple health conditions.   Objective:   Blood pressure 105/70, pulse 65, temperature (!) 97.5 F (36.4 C), temperature source Oral, height  (1.702 m), weight 211 lb (95.7 kg), SpO2 94 %, unknown if currently breastfeeding. Body mass index is 33.05 kg/m.  General: Cooperative, alert, well developed, in no acute distress. HEENT: Conjunctivae and lids unremarkable. Cardiovascular: Regular rhythm.  Lungs: Normal work of breathing. Neurologic: No focal deficits.   Lab Results  Component Value Date   CREATININE 1.00 09/09/2020   BUN 9 09/09/2020   NA 139 09/09/2020   K 4.4 09/09/2020   CL 106 09/09/2020   CO2 19 (L) 09/09/2020   Lab Results  Component Value Date   ALT 22 09/09/2020   AST 25 09/09/2020   ALKPHOS 101 09/09/2020   BILITOT 0.3 09/09/2020   Lab Results  Component Value Date   HGBA1C 5.7 (H) 09/09/2020   Lab Results  Component Value Date   INSULIN 5.4 09/09/2020   Lab Results  Component Value Date   TSH 0.818 09/09/2020   Lab Results  Component Value Date   CHOL 146 09/09/2020   HDL 55 09/09/2020   LDLCALC 81 09/09/2020   TRIG 43 09/09/2020   CHOLHDL 2.7 09/09/2020   Lab Results  Component Value Date   VD25OH 18.6 (L) 09/09/2020   Lab Results  Component Value Date   WBC 4.9 09/09/2020   HGB 9.6 (L) 09/09/2020   HCT 30.7 (L) 09/09/2020   MCV 87 09/09/2020   PLT 265 09/09/2020   Lab Results  Component Value Date   IRON 45 09/09/2020   TIBC 383 09/09/2020   FERRITIN 13 (L) 09/09/2020   Attestation Statements:   Reviewed by clinician on day of visit: allergies, medications, problem list, medical history, surgical history, family history, social history, and previous encounter notes.  I, Insurance claims handler, CMA, am acting as transcriptionist for Helane Rima, DO  I have reviewed the above documentation for accuracy and completeness, and I agree with the above. -  Helane Rima, DO, MS, FAAFP, DABOM - Family  and Bariatric Medicine.

## 2021-08-10 ENCOUNTER — Other Ambulatory Visit (HOSPITAL_COMMUNITY): Payer: Self-pay

## 2021-08-20 ENCOUNTER — Other Ambulatory Visit (HOSPITAL_COMMUNITY): Payer: Self-pay

## 2021-08-20 MED ORDER — MOUNJARO 7.5 MG/0.5ML ~~LOC~~ SOAJ
SUBCUTANEOUS | 0 refills | Status: DC
  Filled 2021-08-20: qty 2, 28d supply, fill #0
  Filled 2021-08-28: qty 3, 28d supply, fill #0
  Filled 2021-09-11: qty 2, 28d supply, fill #0
  Filled 2021-10-13: qty 2, 28d supply, fill #1

## 2021-08-27 ENCOUNTER — Other Ambulatory Visit (HOSPITAL_COMMUNITY): Payer: Self-pay

## 2021-08-28 ENCOUNTER — Other Ambulatory Visit (HOSPITAL_COMMUNITY): Payer: Self-pay

## 2021-08-31 ENCOUNTER — Other Ambulatory Visit (HOSPITAL_COMMUNITY): Payer: Self-pay

## 2021-09-09 ENCOUNTER — Other Ambulatory Visit (HOSPITAL_COMMUNITY): Payer: Self-pay

## 2021-09-11 ENCOUNTER — Other Ambulatory Visit (HOSPITAL_COMMUNITY): Payer: Self-pay

## 2021-09-17 ENCOUNTER — Other Ambulatory Visit (HOSPITAL_COMMUNITY): Payer: Self-pay

## 2021-09-17 MED ORDER — VYVANSE 40 MG PO CAPS
ORAL_CAPSULE | ORAL | 0 refills | Status: AC
  Filled 2021-09-17: qty 30, 30d supply, fill #0

## 2021-10-13 ENCOUNTER — Other Ambulatory Visit (HOSPITAL_COMMUNITY): Payer: Self-pay

## 2021-10-13 MED ORDER — MOUNJARO 7.5 MG/0.5ML ~~LOC~~ SOAJ
SUBCUTANEOUS | 5 refills | Status: DC
  Filled 2021-10-13 – 2021-10-14 (×3): qty 2, 28d supply, fill #0
  Filled 2021-11-06: qty 2, 28d supply, fill #1

## 2021-10-14 ENCOUNTER — Other Ambulatory Visit (HOSPITAL_COMMUNITY): Payer: Self-pay

## 2021-10-29 ENCOUNTER — Other Ambulatory Visit (HOSPITAL_COMMUNITY): Payer: Self-pay

## 2021-11-03 ENCOUNTER — Other Ambulatory Visit (HOSPITAL_COMMUNITY): Payer: Self-pay

## 2021-11-03 MED ORDER — VYVANSE 40 MG PO CAPS
ORAL_CAPSULE | ORAL | 0 refills | Status: DC
  Filled 2021-11-03: qty 30, 30d supply, fill #0

## 2021-11-06 ENCOUNTER — Other Ambulatory Visit (HOSPITAL_COMMUNITY): Payer: Self-pay

## 2021-11-20 ENCOUNTER — Other Ambulatory Visit (HOSPITAL_COMMUNITY): Payer: Self-pay

## 2021-11-25 ENCOUNTER — Encounter (INDEPENDENT_AMBULATORY_CARE_PROVIDER_SITE_OTHER): Payer: Self-pay

## 2021-11-27 ENCOUNTER — Other Ambulatory Visit (HOSPITAL_COMMUNITY): Payer: Self-pay

## 2021-11-27 MED ORDER — METRONIDAZOLE 500 MG PO TABS
ORAL_TABLET | ORAL | 0 refills | Status: DC
  Filled 2021-11-27: qty 14, 7d supply, fill #0

## 2021-12-01 ENCOUNTER — Other Ambulatory Visit (HOSPITAL_COMMUNITY): Payer: Self-pay

## 2021-12-01 MED ORDER — VITAMIN D (ERGOCALCIFEROL) 1.25 MG (50000 UNIT) PO CAPS
ORAL_CAPSULE | ORAL | 0 refills | Status: DC
  Filled 2021-12-01: qty 4, 28d supply, fill #0
  Filled 2022-01-05: qty 4, 28d supply, fill #1
  Filled 2022-02-15: qty 4, 28d supply, fill #2

## 2021-12-01 MED ORDER — WEGOVY 1 MG/0.5ML ~~LOC~~ SOAJ
SUBCUTANEOUS | 0 refills | Status: DC
  Filled 2021-12-01 – 2022-01-05 (×2): qty 2, 28d supply, fill #0

## 2021-12-02 ENCOUNTER — Other Ambulatory Visit (HOSPITAL_COMMUNITY): Payer: Self-pay

## 2021-12-02 MED ORDER — VYVANSE 40 MG PO CAPS
ORAL_CAPSULE | ORAL | 0 refills | Status: DC
  Filled 2021-12-02: qty 30, 30d supply, fill #0

## 2021-12-04 ENCOUNTER — Other Ambulatory Visit (HOSPITAL_COMMUNITY): Payer: Self-pay

## 2021-12-08 ENCOUNTER — Other Ambulatory Visit (HOSPITAL_COMMUNITY): Payer: Self-pay

## 2021-12-15 ENCOUNTER — Other Ambulatory Visit (HOSPITAL_COMMUNITY): Payer: Self-pay

## 2021-12-15 MED ORDER — CHLORHEXIDINE GLUCONATE 0.12 % MT SOLN
OROMUCOSAL | 3 refills | Status: AC
  Filled 2021-12-15: qty 473, 16d supply, fill #0
  Filled 2022-01-05: qty 473, 16d supply, fill #1
  Filled 2022-03-17: qty 473, 16d supply, fill #2
  Filled 2022-05-27: qty 473, 16d supply, fill #3

## 2021-12-15 MED ORDER — AMOXICILLIN 500 MG PO CAPS
ORAL_CAPSULE | ORAL | 0 refills | Status: DC
  Filled 2021-12-15: qty 28, 7d supply, fill #0

## 2021-12-16 ENCOUNTER — Other Ambulatory Visit (HOSPITAL_COMMUNITY): Payer: Self-pay

## 2021-12-31 ENCOUNTER — Other Ambulatory Visit (HOSPITAL_COMMUNITY): Payer: Self-pay

## 2022-01-02 ENCOUNTER — Other Ambulatory Visit (HOSPITAL_COMMUNITY): Payer: Self-pay

## 2022-01-04 ENCOUNTER — Other Ambulatory Visit (HOSPITAL_COMMUNITY): Payer: Self-pay

## 2022-01-04 MED ORDER — WEGOVY 1 MG/0.5ML ~~LOC~~ SOAJ
SUBCUTANEOUS | 0 refills | Status: DC
  Filled 2022-01-04: qty 2, 28d supply, fill #0

## 2022-01-05 ENCOUNTER — Other Ambulatory Visit (HOSPITAL_COMMUNITY): Payer: Self-pay

## 2022-01-06 ENCOUNTER — Other Ambulatory Visit (HOSPITAL_COMMUNITY): Payer: Self-pay

## 2022-01-06 MED ORDER — LISDEXAMFETAMINE DIMESYLATE 40 MG PO CAPS
40.0000 mg | ORAL_CAPSULE | Freq: Every morning | ORAL | 0 refills | Status: DC
  Filled 2022-01-06: qty 30, 30d supply, fill #0

## 2022-01-07 ENCOUNTER — Other Ambulatory Visit (HOSPITAL_COMMUNITY): Payer: Self-pay

## 2022-02-03 ENCOUNTER — Other Ambulatory Visit (HOSPITAL_COMMUNITY): Payer: Self-pay

## 2022-02-09 ENCOUNTER — Other Ambulatory Visit (HOSPITAL_COMMUNITY): Payer: Self-pay

## 2022-02-09 MED ORDER — WEGOVY 1 MG/0.5ML ~~LOC~~ SOAJ
1.0000 mg | SUBCUTANEOUS | 0 refills | Status: AC
  Filled 2022-02-09 – 2022-03-17 (×2): qty 2, 28d supply, fill #0

## 2022-02-09 MED ORDER — LISDEXAMFETAMINE DIMESYLATE 40 MG PO CAPS
40.0000 mg | ORAL_CAPSULE | Freq: Every morning | ORAL | 0 refills | Status: DC
  Filled 2022-02-09: qty 30, 30d supply, fill #0

## 2022-02-10 ENCOUNTER — Other Ambulatory Visit (HOSPITAL_COMMUNITY): Payer: Self-pay

## 2022-02-15 ENCOUNTER — Other Ambulatory Visit (HOSPITAL_COMMUNITY): Payer: Self-pay

## 2022-02-16 ENCOUNTER — Other Ambulatory Visit (HOSPITAL_COMMUNITY): Payer: Self-pay

## 2022-02-22 ENCOUNTER — Other Ambulatory Visit (HOSPITAL_COMMUNITY): Payer: Self-pay

## 2022-02-22 MED ORDER — WEGOVY 1.7 MG/0.75ML ~~LOC~~ SOAJ
SUBCUTANEOUS | 0 refills | Status: DC
  Filled 2022-02-22: qty 3, 30d supply, fill #0

## 2022-02-23 ENCOUNTER — Other Ambulatory Visit (HOSPITAL_COMMUNITY): Payer: Self-pay

## 2022-02-23 MED ORDER — LISDEXAMFETAMINE DIMESYLATE 40 MG PO CAPS
40.0000 mg | ORAL_CAPSULE | Freq: Every morning | ORAL | 0 refills | Status: AC
  Filled 2022-03-17: qty 30, 30d supply, fill #0

## 2022-02-24 ENCOUNTER — Other Ambulatory Visit (HOSPITAL_COMMUNITY): Payer: Self-pay

## 2022-03-10 ENCOUNTER — Other Ambulatory Visit (HOSPITAL_COMMUNITY): Payer: Self-pay

## 2022-03-17 ENCOUNTER — Other Ambulatory Visit (HOSPITAL_COMMUNITY): Payer: Self-pay

## 2022-03-18 ENCOUNTER — Other Ambulatory Visit (HOSPITAL_COMMUNITY): Payer: Self-pay

## 2022-03-18 MED ORDER — WEGOVY 1.7 MG/0.75ML ~~LOC~~ SOAJ
1.7000 mg | SUBCUTANEOUS | 0 refills | Status: DC
  Filled 2022-03-18 – 2022-03-22 (×2): qty 3, 28d supply, fill #0

## 2022-03-18 MED ORDER — VITAMIN D (ERGOCALCIFEROL) 1.25 MG (50000 UNIT) PO CAPS
50000.0000 [IU] | ORAL_CAPSULE | ORAL | 0 refills | Status: DC
  Filled 2022-03-18: qty 4, 28d supply, fill #0
  Filled 2022-05-27: qty 4, 28d supply, fill #1
  Filled 2022-07-22: qty 4, 28d supply, fill #2

## 2022-03-19 ENCOUNTER — Other Ambulatory Visit (HOSPITAL_COMMUNITY): Payer: Self-pay

## 2022-03-22 ENCOUNTER — Other Ambulatory Visit (HOSPITAL_COMMUNITY): Payer: Self-pay

## 2022-03-23 ENCOUNTER — Other Ambulatory Visit (HOSPITAL_COMMUNITY): Payer: Self-pay

## 2022-04-15 ENCOUNTER — Other Ambulatory Visit (HOSPITAL_COMMUNITY): Payer: Self-pay

## 2022-04-19 ENCOUNTER — Other Ambulatory Visit (HOSPITAL_COMMUNITY): Payer: Self-pay

## 2022-04-19 MED ORDER — WEGOVY 1.7 MG/0.75ML ~~LOC~~ SOAJ
1.7000 mg | SUBCUTANEOUS | 0 refills | Status: DC
  Filled 2022-04-19: qty 3, 28d supply, fill #0

## 2022-04-20 ENCOUNTER — Other Ambulatory Visit (HOSPITAL_COMMUNITY): Payer: Self-pay

## 2022-05-01 ENCOUNTER — Other Ambulatory Visit (HOSPITAL_COMMUNITY): Payer: Self-pay

## 2022-05-03 ENCOUNTER — Other Ambulatory Visit: Payer: Self-pay

## 2022-05-03 ENCOUNTER — Other Ambulatory Visit (HOSPITAL_COMMUNITY): Payer: Self-pay

## 2022-05-05 ENCOUNTER — Other Ambulatory Visit (HOSPITAL_COMMUNITY): Payer: Self-pay

## 2022-05-05 MED ORDER — LISDEXAMFETAMINE DIMESYLATE 40 MG PO CAPS
40.0000 mg | ORAL_CAPSULE | Freq: Every morning | ORAL | 0 refills | Status: DC
  Filled 2022-05-05: qty 30, 30d supply, fill #0

## 2022-05-07 ENCOUNTER — Other Ambulatory Visit (HOSPITAL_COMMUNITY): Payer: Self-pay

## 2022-05-27 ENCOUNTER — Other Ambulatory Visit (HOSPITAL_COMMUNITY): Payer: Self-pay

## 2022-05-27 ENCOUNTER — Other Ambulatory Visit: Payer: Self-pay

## 2022-05-31 ENCOUNTER — Other Ambulatory Visit (HOSPITAL_COMMUNITY): Payer: Self-pay

## 2022-05-31 MED ORDER — WEGOVY 1.7 MG/0.75ML ~~LOC~~ SOAJ
1.7000 mg | SUBCUTANEOUS | 1 refills | Status: DC
  Filled 2022-06-04: qty 3, 28d supply, fill #0
  Filled 2022-06-18 – 2022-06-27 (×2): qty 3, 28d supply, fill #1

## 2022-06-03 ENCOUNTER — Other Ambulatory Visit (HOSPITAL_COMMUNITY): Payer: Self-pay

## 2022-06-03 ENCOUNTER — Other Ambulatory Visit: Payer: Self-pay

## 2022-06-04 ENCOUNTER — Other Ambulatory Visit: Payer: Self-pay

## 2022-06-04 ENCOUNTER — Other Ambulatory Visit (HOSPITAL_COMMUNITY): Payer: Self-pay

## 2022-06-08 ENCOUNTER — Other Ambulatory Visit (HOSPITAL_COMMUNITY): Payer: Self-pay

## 2022-06-18 ENCOUNTER — Other Ambulatory Visit (HOSPITAL_COMMUNITY): Payer: Self-pay

## 2022-06-19 ENCOUNTER — Other Ambulatory Visit (HOSPITAL_COMMUNITY): Payer: Self-pay

## 2022-06-20 ENCOUNTER — Other Ambulatory Visit (HOSPITAL_COMMUNITY): Payer: Self-pay

## 2022-06-20 MED ORDER — LISDEXAMFETAMINE DIMESYLATE 40 MG PO CAPS
ORAL_CAPSULE | ORAL | 0 refills | Status: DC
  Filled 2022-06-20: qty 30, 30d supply, fill #0

## 2022-06-21 ENCOUNTER — Other Ambulatory Visit (HOSPITAL_COMMUNITY): Payer: Self-pay

## 2022-06-21 ENCOUNTER — Telehealth: Payer: 59 | Admitting: Physician Assistant

## 2022-06-21 ENCOUNTER — Other Ambulatory Visit: Payer: Self-pay

## 2022-06-21 DIAGNOSIS — M545 Low back pain, unspecified: Secondary | ICD-10-CM

## 2022-06-21 MED ORDER — CYCLOBENZAPRINE HCL 10 MG PO TABS
5.0000 mg | ORAL_TABLET | Freq: Three times a day (TID) | ORAL | 0 refills | Status: AC | PRN
  Filled 2022-06-21: qty 30, 10d supply, fill #0

## 2022-06-21 MED ORDER — NAPROXEN 500 MG PO TABS
500.0000 mg | ORAL_TABLET | Freq: Two times a day (BID) | ORAL | 0 refills | Status: AC
  Filled 2022-06-21: qty 30, 15d supply, fill #0

## 2022-06-21 NOTE — Progress Notes (Signed)

## 2022-07-05 ENCOUNTER — Other Ambulatory Visit (HOSPITAL_COMMUNITY): Payer: Self-pay

## 2022-07-05 MED ORDER — WEGOVY 1.7 MG/0.75ML ~~LOC~~ SOAJ
1.7000 mg | SUBCUTANEOUS | 1 refills | Status: DC
  Filled 2022-07-05: qty 3, 28d supply, fill #0

## 2022-07-05 MED ORDER — LISDEXAMFETAMINE DIMESYLATE 40 MG PO CAPS
40.0000 mg | ORAL_CAPSULE | Freq: Every day | ORAL | 0 refills | Status: AC
  Filled 2022-07-05 – 2022-07-22 (×2): qty 30, 30d supply, fill #0

## 2022-07-10 ENCOUNTER — Other Ambulatory Visit (HOSPITAL_COMMUNITY): Payer: Self-pay

## 2022-07-19 ENCOUNTER — Other Ambulatory Visit (HOSPITAL_COMMUNITY): Payer: Self-pay

## 2022-07-22 ENCOUNTER — Other Ambulatory Visit (HOSPITAL_COMMUNITY): Payer: Self-pay

## 2022-07-26 ENCOUNTER — Other Ambulatory Visit (HOSPITAL_COMMUNITY): Payer: Self-pay

## 2022-07-27 ENCOUNTER — Other Ambulatory Visit: Payer: Self-pay

## 2022-07-27 ENCOUNTER — Other Ambulatory Visit (HOSPITAL_COMMUNITY): Payer: Self-pay

## 2022-08-16 ENCOUNTER — Other Ambulatory Visit (HOSPITAL_COMMUNITY): Payer: Self-pay

## 2022-08-16 MED ORDER — WEGOVY 2.4 MG/0.75ML ~~LOC~~ SOAJ
2.4000 mg | SUBCUTANEOUS | 0 refills | Status: AC
  Filled 2022-08-16: qty 3, 28d supply, fill #0
  Filled 2022-09-10: qty 3, 28d supply, fill #1
  Filled 2022-10-14: qty 3, 28d supply, fill #2

## 2022-08-16 MED ORDER — LISDEXAMFETAMINE DIMESYLATE 50 MG PO CAPS
50.0000 mg | ORAL_CAPSULE | Freq: Every morning | ORAL | 0 refills | Status: DC
  Filled 2022-08-16: qty 1, 1d supply, fill #0

## 2022-08-17 ENCOUNTER — Other Ambulatory Visit (HOSPITAL_COMMUNITY): Payer: Self-pay

## 2022-08-17 ENCOUNTER — Other Ambulatory Visit: Payer: Self-pay

## 2022-08-17 MED ORDER — LISDEXAMFETAMINE DIMESYLATE 50 MG PO CAPS
50.0000 mg | ORAL_CAPSULE | Freq: Every day | ORAL | 0 refills | Status: DC
  Filled 2022-08-17 – 2022-08-26 (×2): qty 30, 30d supply, fill #0

## 2022-08-25 ENCOUNTER — Other Ambulatory Visit: Payer: Self-pay

## 2022-08-25 ENCOUNTER — Other Ambulatory Visit (HOSPITAL_COMMUNITY): Payer: Self-pay

## 2022-08-26 ENCOUNTER — Other Ambulatory Visit (HOSPITAL_COMMUNITY): Payer: Self-pay

## 2022-08-26 ENCOUNTER — Other Ambulatory Visit: Payer: Self-pay

## 2022-09-10 ENCOUNTER — Other Ambulatory Visit (HOSPITAL_COMMUNITY): Payer: Self-pay

## 2022-09-13 ENCOUNTER — Other Ambulatory Visit: Payer: Self-pay

## 2022-09-14 ENCOUNTER — Other Ambulatory Visit (HOSPITAL_COMMUNITY): Payer: Self-pay

## 2022-09-14 MED ORDER — VITAMIN D (ERGOCALCIFEROL) 1.25 MG (50000 UNIT) PO CAPS
50000.0000 [IU] | ORAL_CAPSULE | ORAL | 0 refills | Status: DC
  Filled 2022-09-14: qty 4, 28d supply, fill #0
  Filled 2022-10-07: qty 4, 28d supply, fill #1
  Filled 2022-11-04: qty 4, 28d supply, fill #2

## 2022-09-24 ENCOUNTER — Other Ambulatory Visit (HOSPITAL_COMMUNITY): Payer: Self-pay

## 2022-09-27 ENCOUNTER — Other Ambulatory Visit (HOSPITAL_COMMUNITY): Payer: Self-pay

## 2022-09-27 MED ORDER — LISDEXAMFETAMINE DIMESYLATE 50 MG PO CAPS
ORAL_CAPSULE | ORAL | 0 refills | Status: DC
  Filled 2022-09-27: qty 30, 30d supply, fill #0

## 2022-10-05 ENCOUNTER — Other Ambulatory Visit (HOSPITAL_COMMUNITY): Payer: Self-pay

## 2022-10-05 MED ORDER — LISDEXAMFETAMINE DIMESYLATE 50 MG PO CAPS
50.0000 mg | ORAL_CAPSULE | Freq: Every day | ORAL | 0 refills | Status: DC
  Filled 2022-10-05 – 2022-11-04 (×2): qty 30, 30d supply, fill #0

## 2022-10-05 MED ORDER — WEGOVY 2.4 MG/0.75ML ~~LOC~~ SOAJ
2.4000 mg | SUBCUTANEOUS | 0 refills | Status: AC
  Filled 2022-10-05 – 2022-11-04 (×2): qty 3, 28d supply, fill #0
  Filled 2023-04-25: qty 3, 28d supply, fill #1
  Filled 2023-05-29 – 2023-07-20 (×3): qty 3, 28d supply, fill #2

## 2022-10-12 ENCOUNTER — Other Ambulatory Visit (HOSPITAL_COMMUNITY): Payer: Self-pay

## 2022-10-16 ENCOUNTER — Other Ambulatory Visit (HOSPITAL_COMMUNITY): Payer: Self-pay

## 2022-11-05 ENCOUNTER — Other Ambulatory Visit (HOSPITAL_COMMUNITY): Payer: Self-pay

## 2022-11-05 ENCOUNTER — Other Ambulatory Visit: Payer: Self-pay

## 2022-11-06 ENCOUNTER — Other Ambulatory Visit (HOSPITAL_COMMUNITY): Payer: Self-pay

## 2022-11-15 ENCOUNTER — Other Ambulatory Visit (HOSPITAL_COMMUNITY): Payer: Self-pay

## 2022-11-15 MED ORDER — WEGOVY 2.4 MG/0.75ML ~~LOC~~ SOAJ
2.4000 mg | SUBCUTANEOUS | 0 refills | Status: AC
  Filled 2022-11-15 – 2023-03-13 (×3): qty 3, 28d supply, fill #0
  Filled 2023-08-24 – 2023-10-13 (×2): qty 3, 28d supply, fill #1

## 2022-11-15 MED ORDER — LISDEXAMFETAMINE DIMESYLATE 50 MG PO CAPS
50.0000 mg | ORAL_CAPSULE | Freq: Every day | ORAL | 0 refills | Status: DC
  Filled 2023-01-31: qty 30, 30d supply, fill #0

## 2022-12-28 ENCOUNTER — Other Ambulatory Visit: Payer: Self-pay

## 2022-12-28 ENCOUNTER — Other Ambulatory Visit (HOSPITAL_COMMUNITY): Payer: Self-pay

## 2022-12-28 MED ORDER — VITAMIN D (ERGOCALCIFEROL) 1.25 MG (50000 UNIT) PO CAPS
50000.0000 [IU] | ORAL_CAPSULE | ORAL | 3 refills | Status: AC
  Filled 2022-12-28: qty 4, 28d supply, fill #0
  Filled 2023-01-31: qty 4, 28d supply, fill #1
  Filled 2023-04-25: qty 4, 28d supply, fill #2
  Filled 2023-05-29 – 2023-06-10 (×2): qty 4, 28d supply, fill #3

## 2022-12-28 MED ORDER — LISDEXAMFETAMINE DIMESYLATE 50 MG PO CAPS
50.0000 mg | ORAL_CAPSULE | Freq: Every day | ORAL | 0 refills | Status: DC
  Filled 2022-12-28: qty 30, 30d supply, fill #0

## 2022-12-28 MED ORDER — WEGOVY 2.4 MG/0.75ML ~~LOC~~ SOAJ
2.4000 mg | SUBCUTANEOUS | 0 refills | Status: AC
Start: 2022-12-27 — End: ?
  Filled 2022-12-28: qty 3, 28d supply, fill #0
  Filled 2023-06-10: qty 3, 28d supply, fill #1
  Filled 2023-09-07: qty 3, 28d supply, fill #2

## 2022-12-29 ENCOUNTER — Other Ambulatory Visit (HOSPITAL_COMMUNITY): Payer: Self-pay

## 2022-12-29 ENCOUNTER — Other Ambulatory Visit: Payer: Self-pay

## 2022-12-29 MED ORDER — FUROSEMIDE 20 MG PO TABS
20.0000 mg | ORAL_TABLET | Freq: Every day | ORAL | 0 refills | Status: AC | PRN
  Filled 2022-12-29: qty 24, 24d supply, fill #0

## 2022-12-29 MED ORDER — POTASSIUM CHLORIDE ER 20 MEQ PO TBCR
20.0000 meq | EXTENDED_RELEASE_TABLET | Freq: Every day | ORAL | 0 refills | Status: AC | PRN
  Filled 2022-12-29: qty 24, 24d supply, fill #0

## 2023-01-31 ENCOUNTER — Other Ambulatory Visit (HOSPITAL_COMMUNITY): Payer: Self-pay

## 2023-01-31 ENCOUNTER — Other Ambulatory Visit: Payer: Self-pay

## 2023-02-08 ENCOUNTER — Other Ambulatory Visit (HOSPITAL_COMMUNITY): Payer: Self-pay

## 2023-03-13 ENCOUNTER — Other Ambulatory Visit (HOSPITAL_COMMUNITY): Payer: Self-pay

## 2023-03-14 ENCOUNTER — Other Ambulatory Visit: Payer: Self-pay

## 2023-03-14 ENCOUNTER — Other Ambulatory Visit (HOSPITAL_COMMUNITY): Payer: Self-pay

## 2023-03-14 MED ORDER — LISDEXAMFETAMINE DIMESYLATE 50 MG PO CAPS
50.0000 mg | ORAL_CAPSULE | Freq: Every day | ORAL | 0 refills | Status: AC
Start: 2023-03-14 — End: ?
  Filled 2023-03-14: qty 30, 30d supply, fill #0

## 2023-03-24 ENCOUNTER — Other Ambulatory Visit (HOSPITAL_COMMUNITY): Payer: Self-pay

## 2023-04-25 ENCOUNTER — Other Ambulatory Visit (HOSPITAL_COMMUNITY): Payer: Self-pay

## 2023-04-25 ENCOUNTER — Other Ambulatory Visit: Payer: Self-pay

## 2023-04-25 MED ORDER — LISDEXAMFETAMINE DIMESYLATE 50 MG PO CAPS
50.0000 mg | ORAL_CAPSULE | Freq: Every day | ORAL | 0 refills | Status: DC
Start: 2023-04-25 — End: 2023-06-01
  Filled 2023-04-25: qty 30, 30d supply, fill #0

## 2023-05-29 ENCOUNTER — Other Ambulatory Visit (HOSPITAL_COMMUNITY): Payer: Self-pay

## 2023-05-30 ENCOUNTER — Other Ambulatory Visit: Payer: Self-pay

## 2023-06-01 ENCOUNTER — Other Ambulatory Visit (HOSPITAL_COMMUNITY): Payer: Self-pay

## 2023-06-01 MED ORDER — LISDEXAMFETAMINE DIMESYLATE 50 MG PO CAPS
50.0000 mg | ORAL_CAPSULE | Freq: Every day | ORAL | 0 refills | Status: DC
  Filled 2023-06-01: qty 30, 30d supply, fill #0

## 2023-06-01 MED ORDER — VITAMIN D (ERGOCALCIFEROL) 1.25 MG (50000 UNIT) PO CAPS
50000.0000 [IU] | ORAL_CAPSULE | ORAL | 1 refills | Status: DC
  Filled 2023-06-01 – 2023-07-20 (×3): qty 4, 28d supply, fill #0
  Filled 2023-08-24 – 2023-09-07 (×2): qty 4, 28d supply, fill #1
  Filled 2023-10-12: qty 4, 28d supply, fill #2
  Filled 2023-11-07 – 2023-11-18 (×2): qty 4, 28d supply, fill #3
  Filled 2023-12-13 – 2023-12-23 (×2): qty 4, 28d supply, fill #4
  Filled 2024-01-19 – 2024-02-05 (×2): qty 4, 28d supply, fill #5

## 2023-06-09 ENCOUNTER — Other Ambulatory Visit (HOSPITAL_COMMUNITY): Payer: Self-pay

## 2023-06-10 ENCOUNTER — Other Ambulatory Visit (HOSPITAL_COMMUNITY): Payer: Self-pay

## 2023-06-28 ENCOUNTER — Other Ambulatory Visit (HOSPITAL_COMMUNITY): Payer: Self-pay

## 2023-07-03 ENCOUNTER — Other Ambulatory Visit (HOSPITAL_COMMUNITY): Payer: Self-pay

## 2023-07-04 ENCOUNTER — Other Ambulatory Visit (HOSPITAL_COMMUNITY): Payer: Self-pay

## 2023-07-04 ENCOUNTER — Other Ambulatory Visit: Payer: Self-pay

## 2023-07-07 ENCOUNTER — Other Ambulatory Visit: Payer: Self-pay

## 2023-07-07 ENCOUNTER — Other Ambulatory Visit (HOSPITAL_COMMUNITY): Payer: Self-pay

## 2023-07-07 MED ORDER — LISDEXAMFETAMINE DIMESYLATE 50 MG PO CAPS
50.0000 mg | ORAL_CAPSULE | Freq: Every day | ORAL | 0 refills | Status: DC
  Filled 2023-07-07 – 2023-07-26 (×2): qty 30, 30d supply, fill #0

## 2023-07-14 ENCOUNTER — Other Ambulatory Visit (HOSPITAL_COMMUNITY): Payer: Self-pay

## 2023-07-20 ENCOUNTER — Other Ambulatory Visit: Payer: Self-pay

## 2023-07-20 ENCOUNTER — Other Ambulatory Visit (HOSPITAL_COMMUNITY): Payer: Self-pay

## 2023-07-23 ENCOUNTER — Other Ambulatory Visit (HOSPITAL_COMMUNITY): Payer: Self-pay

## 2023-07-26 ENCOUNTER — Other Ambulatory Visit (HOSPITAL_COMMUNITY): Payer: Self-pay

## 2023-08-24 ENCOUNTER — Other Ambulatory Visit (HOSPITAL_COMMUNITY): Payer: Self-pay

## 2023-08-25 ENCOUNTER — Other Ambulatory Visit (HOSPITAL_COMMUNITY): Payer: Self-pay

## 2023-08-25 ENCOUNTER — Other Ambulatory Visit: Payer: Self-pay

## 2023-08-29 ENCOUNTER — Other Ambulatory Visit (HOSPITAL_COMMUNITY): Payer: Self-pay

## 2023-08-29 MED ORDER — LISDEXAMFETAMINE DIMESYLATE 50 MG PO CAPS
50.0000 mg | ORAL_CAPSULE | Freq: Every day | ORAL | 0 refills | Status: DC
  Filled 2023-08-29: qty 30, 30d supply, fill #0

## 2023-09-05 ENCOUNTER — Other Ambulatory Visit (HOSPITAL_COMMUNITY): Payer: Self-pay

## 2023-09-07 ENCOUNTER — Other Ambulatory Visit (HOSPITAL_COMMUNITY): Payer: Self-pay

## 2023-09-17 ENCOUNTER — Other Ambulatory Visit (HOSPITAL_COMMUNITY): Payer: Self-pay

## 2023-10-11 ENCOUNTER — Other Ambulatory Visit (HOSPITAL_COMMUNITY): Payer: Self-pay

## 2023-10-11 MED ORDER — WEGOVY 2.4 MG/0.75ML ~~LOC~~ SOAJ
2.4000 mg | SUBCUTANEOUS | 1 refills | Status: DC
  Filled 2023-10-11: qty 3, 28d supply, fill #0

## 2023-10-11 MED ORDER — LISDEXAMFETAMINE DIMESYLATE 50 MG PO CAPS
50.0000 mg | ORAL_CAPSULE | Freq: Every morning | ORAL | 0 refills | Status: AC
  Filled 2023-10-11 – 2023-12-16 (×3): qty 30, 30d supply, fill #0

## 2023-10-11 MED ORDER — LISDEXAMFETAMINE DIMESYLATE 50 MG PO CAPS
50.0000 mg | ORAL_CAPSULE | Freq: Every morning | ORAL | 0 refills | Status: AC
  Filled 2023-10-11 – 2023-11-18 (×3): qty 30, 30d supply, fill #0

## 2023-10-11 MED ORDER — LISDEXAMFETAMINE DIMESYLATE 50 MG PO CAPS
50.0000 mg | ORAL_CAPSULE | Freq: Every morning | ORAL | 0 refills | Status: AC
  Filled 2023-10-11: qty 30, 30d supply, fill #0

## 2023-10-12 ENCOUNTER — Other Ambulatory Visit (HOSPITAL_COMMUNITY): Payer: Self-pay

## 2023-10-12 MED ORDER — ZEPBOUND 10 MG/0.5ML ~~LOC~~ SOAJ
10.0000 mg | SUBCUTANEOUS | 1 refills | Status: DC
  Filled 2023-10-12: qty 2, 28d supply, fill #0
  Filled 2023-11-07 – 2023-11-18 (×2): qty 2, 28d supply, fill #1

## 2023-10-13 ENCOUNTER — Other Ambulatory Visit (HOSPITAL_COMMUNITY): Payer: Self-pay

## 2023-10-17 ENCOUNTER — Other Ambulatory Visit (HOSPITAL_BASED_OUTPATIENT_CLINIC_OR_DEPARTMENT_OTHER): Payer: Self-pay

## 2023-10-17 ENCOUNTER — Other Ambulatory Visit (HOSPITAL_COMMUNITY): Payer: Self-pay

## 2023-10-17 MED ORDER — OFLOXACIN 0.3 % OP SOLN
1.0000 [drp] | OPHTHALMIC | 0 refills | Status: AC
  Filled 2023-10-17: qty 5, 25d supply, fill #0

## 2023-11-07 ENCOUNTER — Other Ambulatory Visit (HOSPITAL_COMMUNITY): Payer: Self-pay

## 2023-11-08 ENCOUNTER — Other Ambulatory Visit: Payer: Self-pay

## 2023-11-16 ENCOUNTER — Other Ambulatory Visit (HOSPITAL_COMMUNITY): Payer: Self-pay

## 2023-11-17 ENCOUNTER — Other Ambulatory Visit (HOSPITAL_COMMUNITY): Payer: Self-pay

## 2023-11-18 ENCOUNTER — Other Ambulatory Visit (HOSPITAL_COMMUNITY): Payer: Self-pay

## 2023-12-13 ENCOUNTER — Other Ambulatory Visit: Payer: Self-pay

## 2023-12-13 ENCOUNTER — Other Ambulatory Visit (HOSPITAL_COMMUNITY): Payer: Self-pay

## 2023-12-13 MED ORDER — ZEPBOUND 10 MG/0.5ML ~~LOC~~ SOAJ
10.0000 mg | SUBCUTANEOUS | 1 refills | Status: AC
  Filled 2023-12-13 – 2023-12-23 (×2): qty 2, 28d supply, fill #0
  Filled 2024-03-11: qty 2, 28d supply, fill #1

## 2023-12-16 ENCOUNTER — Other Ambulatory Visit (HOSPITAL_COMMUNITY): Payer: Self-pay

## 2023-12-16 ENCOUNTER — Other Ambulatory Visit: Payer: Self-pay

## 2023-12-22 ENCOUNTER — Other Ambulatory Visit (HOSPITAL_COMMUNITY): Payer: Self-pay

## 2023-12-23 ENCOUNTER — Other Ambulatory Visit (HOSPITAL_COMMUNITY): Payer: Self-pay

## 2024-01-19 ENCOUNTER — Other Ambulatory Visit: Payer: Self-pay

## 2024-01-19 ENCOUNTER — Other Ambulatory Visit (HOSPITAL_COMMUNITY): Payer: Self-pay

## 2024-01-24 ENCOUNTER — Other Ambulatory Visit (HOSPITAL_COMMUNITY): Payer: Self-pay

## 2024-01-24 MED ORDER — LISDEXAMFETAMINE DIMESYLATE 50 MG PO CAPS
50.0000 mg | ORAL_CAPSULE | Freq: Every day | ORAL | 0 refills | Status: AC
  Filled 2024-01-24 – 2024-02-05 (×2): qty 30, 30d supply, fill #0

## 2024-01-31 ENCOUNTER — Other Ambulatory Visit (HOSPITAL_COMMUNITY): Payer: Self-pay

## 2024-01-31 ENCOUNTER — Other Ambulatory Visit: Payer: Self-pay

## 2024-01-31 MED ORDER — ZEPBOUND 10 MG/0.5ML ~~LOC~~ SOAJ
10.0000 mg | SUBCUTANEOUS | 1 refills | Status: AC
  Filled 2024-01-31: qty 2, 28d supply, fill #0

## 2024-01-31 MED ORDER — LISDEXAMFETAMINE DIMESYLATE 50 MG PO CAPS
50.0000 mg | ORAL_CAPSULE | Freq: Every day | ORAL | 0 refills | Status: DC
  Filled 2024-01-31 – 2024-03-11 (×2): qty 30, 30d supply, fill #0

## 2024-02-03 ENCOUNTER — Other Ambulatory Visit (HOSPITAL_COMMUNITY): Payer: Self-pay

## 2024-02-05 ENCOUNTER — Other Ambulatory Visit (HOSPITAL_COMMUNITY): Payer: Self-pay

## 2024-02-16 ENCOUNTER — Other Ambulatory Visit (HOSPITAL_COMMUNITY): Payer: Self-pay

## 2024-03-11 ENCOUNTER — Other Ambulatory Visit (HOSPITAL_COMMUNITY): Payer: Self-pay

## 2024-03-12 ENCOUNTER — Other Ambulatory Visit: Payer: Self-pay

## 2024-03-20 ENCOUNTER — Other Ambulatory Visit (HOSPITAL_COMMUNITY): Payer: Self-pay

## 2024-03-20 ENCOUNTER — Other Ambulatory Visit: Payer: Self-pay

## 2024-03-20 MED ORDER — LISDEXAMFETAMINE DIMESYLATE 50 MG PO CAPS
50.0000 mg | ORAL_CAPSULE | Freq: Every morning | ORAL | 0 refills | Status: AC
  Filled 2024-03-23 – 2024-04-09 (×3): qty 30, 30d supply, fill #0

## 2024-03-20 MED ORDER — VITAMIN D (ERGOCALCIFEROL) 1.25 MG (50000 UNIT) PO CAPS
50000.0000 [IU] | ORAL_CAPSULE | ORAL | 0 refills | Status: AC
  Filled 2024-03-20: qty 4, 28d supply, fill #0
  Filled 2024-04-06: qty 4, 28d supply, fill #1

## 2024-03-21 ENCOUNTER — Other Ambulatory Visit: Payer: Self-pay

## 2024-03-21 ENCOUNTER — Other Ambulatory Visit (HOSPITAL_COMMUNITY): Payer: Self-pay

## 2024-03-21 MED ORDER — ZEPBOUND 12.5 MG/0.5ML ~~LOC~~ SOAJ
12.5000 mg | SUBCUTANEOUS | 0 refills | Status: AC
  Filled 2024-03-21 – 2024-03-29 (×4): qty 2, 28d supply, fill #0
  Filled 2024-04-06: qty 2, 28d supply, fill #1

## 2024-03-23 ENCOUNTER — Other Ambulatory Visit: Payer: Self-pay

## 2024-03-23 ENCOUNTER — Other Ambulatory Visit (HOSPITAL_COMMUNITY): Payer: Self-pay

## 2024-03-29 ENCOUNTER — Other Ambulatory Visit (HOSPITAL_COMMUNITY): Payer: Self-pay

## 2024-03-29 ENCOUNTER — Other Ambulatory Visit: Payer: Self-pay

## 2024-04-06 ENCOUNTER — Other Ambulatory Visit (HOSPITAL_COMMUNITY): Payer: Self-pay

## 2024-04-06 ENCOUNTER — Other Ambulatory Visit: Payer: Self-pay

## 2024-04-09 ENCOUNTER — Other Ambulatory Visit (HOSPITAL_COMMUNITY): Payer: Self-pay

## 2024-04-09 ENCOUNTER — Other Ambulatory Visit: Payer: Self-pay

## 2024-04-11 ENCOUNTER — Other Ambulatory Visit (HOSPITAL_COMMUNITY): Payer: Self-pay

## 2024-04-11 ENCOUNTER — Encounter (HOSPITAL_COMMUNITY): Payer: Self-pay

## 2024-05-09 ENCOUNTER — Other Ambulatory Visit: Payer: Self-pay

## 2024-05-09 ENCOUNTER — Other Ambulatory Visit (HOSPITAL_COMMUNITY): Payer: Self-pay

## 2024-05-09 MED ORDER — LISDEXAMFETAMINE DIMESYLATE 60 MG PO CAPS
60.0000 mg | ORAL_CAPSULE | Freq: Every morning | ORAL | 0 refills | Status: AC
  Filled 2024-05-09 – 2024-05-21 (×2): qty 30, 30d supply, fill #0

## 2024-05-09 MED ORDER — ZEPBOUND 12.5 MG/0.5ML ~~LOC~~ SOAJ
12.5000 mg | SUBCUTANEOUS | 0 refills | Status: AC
  Filled 2024-05-09 – 2024-05-21 (×2): qty 2, 28d supply, fill #0

## 2024-05-09 MED ORDER — VITAMIN D (ERGOCALCIFEROL) 1.25 MG (50000 UNIT) PO CAPS
50000.0000 [IU] | ORAL_CAPSULE | ORAL | 1 refills | Status: AC
  Filled 2024-05-09 – 2024-05-21 (×2): qty 4, 28d supply, fill #0

## 2024-05-21 ENCOUNTER — Other Ambulatory Visit (HOSPITAL_COMMUNITY): Payer: Self-pay
